# Patient Record
Sex: Male | Born: 1950 | Race: White | Hispanic: No | Marital: Married | Smoking: Never smoker
Health system: Southern US, Community
[De-identification: ages and names within clinical notes are randomized; demographics above are authoritative.]

## PROBLEM LIST (undated history)

## (undated) DIAGNOSIS — K219 Gastro-esophageal reflux disease without esophagitis: Secondary | ICD-10-CM

## (undated) DIAGNOSIS — Z9889 Other specified postprocedural states: Secondary | ICD-10-CM

## (undated) DIAGNOSIS — I82409 Acute embolism and thrombosis of unspecified deep veins of unspecified lower extremity: Secondary | ICD-10-CM

## (undated) HISTORY — DX: Acute embolism and thrombosis of unspecified deep veins of unspecified lower extremity: I82.409

## (undated) HISTORY — DX: Other specified postprocedural states: Z98.890

## (undated) HISTORY — PX: ESOPHAGECTOMY: SUR457

## (undated) HISTORY — PX: HAND SURGERY: SHX662

## (undated) HISTORY — PX: APPENDECTOMY: SHX54

## (undated) HISTORY — DX: Gastro-esophageal reflux disease without esophagitis: K21.9

## (undated) HISTORY — PX: HERNIA REPAIR: SHX51

---

## 1996-03-20 DIAGNOSIS — Z9889 Other specified postprocedural states: Secondary | ICD-10-CM

## 1996-03-20 HISTORY — DX: Other specified postprocedural states: Z98.890

## 2005-05-15 DIAGNOSIS — IMO0002 Reserved for concepts with insufficient information to code with codable children: Secondary | ICD-10-CM | POA: Insufficient documentation

## 2010-03-10 ENCOUNTER — Ambulatory Visit (HOSPITAL_COMMUNITY)
Admission: RE | Admit: 2010-03-10 | Discharge: 2010-03-10 | Payer: Self-pay | Source: Home / Self Care | Attending: Family Medicine | Admitting: Family Medicine

## 2010-05-30 LAB — CREATININE, SERUM
Creatinine, Ser: 1.06 mg/dL (ref 0.4–1.5)
GFR calc Af Amer: >60 mL/min (ref >60)
GFR calc non Af Amer: >60 mL/min (ref >60)

## 2010-11-19 DIAGNOSIS — Z9889 Other specified postprocedural states: Secondary | ICD-10-CM

## 2010-11-19 HISTORY — DX: Other specified postprocedural states: Z98.890

## 2010-12-12 ENCOUNTER — Encounter: Payer: Self-pay | Admitting: Gastroenterology

## 2010-12-12 ENCOUNTER — Ambulatory Visit (INDEPENDENT_AMBULATORY_CARE_PROVIDER_SITE_OTHER): Payer: Medicaid Other | Admitting: Gastroenterology

## 2010-12-12 VITALS — BP 152/87 | HR 51 | Temp 98.0°F | Ht 67.0 in | Wt 225.6 lb

## 2010-12-12 DIAGNOSIS — K219 Gastro-esophageal reflux disease without esophagitis: Secondary | ICD-10-CM

## 2010-12-12 DIAGNOSIS — Z7901 Long term (current) use of anticoagulants: Secondary | ICD-10-CM

## 2010-12-12 DIAGNOSIS — Z1211 Encounter for screening for malignant neoplasm of colon: Secondary | ICD-10-CM

## 2010-12-12 LAB — PROTIME-INR
INR: 1.72 — ABNORMAL HIGH (ref <1.50)
Prothrombin Time: 20.8 seconds — ABNORMAL HIGH (ref 11.6–15.2)

## 2010-12-12 NOTE — Patient Instructions (Signed)
We have set you up for an upper endoscopy with Dr. Darrick Penna. PLEASE hold your Coumadin starting TODAY.  Please have labs drawn today. We want to make sure you are not too anticoagulated for the procedure. We will contact you if any changes.

## 2010-12-12 NOTE — Progress Notes (Signed)
Referring Provider: Kandice Robinsons, FNP Primary Care Physician:  No primary provider on file. Primary Gastroenterologist:  Dr. Darrick Penna   Chief Complaint  Patient presents with  . EGD    HPI:   Jose Farmer is a pleasant 60 year old male who presents today at the request of Ms. Smith-Overman, FNP due to chronic GERD and now with symptoms of sore throat. He had been seen by ENT who recommended an EGD. Pt reports sore throat X 2 months. Denies dysphagia or odynophagia. He was on Dexilant in the past, but Medicaid did not cover this. He was switched to Protonix, which has seemed to help. Denies any abdominal pain, nausea, vomiting, melena or brbpr. He denies any weight loss or lack of appetite.  He has a history of multiple endoscopies in the remote past, last approximately 21 years ago by Dr. Dagoberto Ligas in [place], [place]. 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 We do not have any of these reports. His last colonoscopy was in 1998, which I did not know at the time of this office visit. Apparently, the colonoscopy was normal. This was done by Dr. Elder Cyphers at [facility].   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 On chronic Coumadin, managed by the [facility]. He routinely has held his Coumadin X 3-4 days with any procedure since then.     7.5 mg Coumadin on M, W, F, 5 on Tues, Thurs, Sat.   Past Medical History  Diagnosis Date  . GERD (gastroesophageal reflux disease)   . 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   . Gout     Past Surgical History  Procedure Date  . Appendectomy   . Hernia repair     ventral  . Esophagectomy     ? per pt report  . Hand surgery     tendons    Current Outpatient Prescriptions  Medication Sig Dispense Refill  . allopurinol (ZYLOPRIM) 300 MG tablet Take 300 mg by mouth daily. 1/2  tablet daily        . azelastine (ASTELIN) 137 MCG/SPRAY nasal spray Place 1 spray into the nose 2 (two) times daily. Use in each nostril as directed       . fluticasone (VERAMYST) 27.5 MCG/SPRAY nasal spray Place 2 sprays into the nose daily.        . furosemide (LASIX) 40 MG tablet Take 40 mg by mouth 2 (two) times daily.        . pantoprazole (PROTONIX) 40 MG tablet Take 40 mg by mouth daily.        . ranitidine (ZANTAC) 150 MG capsule Take 150 mg by mouth every evening.       . warfarin (COUMADIN) 5 MG tablet Take 5 mg by mouth daily. 7.5 mg on M, W, F. 5 mg on Tues, Thurs, Sat.        Allergies as of 12/12/2010  . (Not on File)    Family History  Problem Relation Age of Onset  . Colon cancer Neg Hx     History   Social History  . Marital Status: Married    Spouse Name: N/A    Number of Children: N/A  . Years of Education: N/A   Occupational History  . disability    Social History Main Topics  . Smoking status: Never Smoker   .  Smokeless tobacco: Not on file  . Alcohol Use: No  . Drug Use: No  . Sexually Active: Not on file   Other Topics Concern  . Not on file   Social History Narrative  . No narrative on file    Review of Systems: Gen: Denies any fever, chills, loss of appetite, fatigue, weight loss. CV: Denies chest pain, heart palpitations, syncope, peripheral edema. Resp: Denies shortness of breath with rest, cough, wheezing GI: Denies dysphagia or odynophagia. Denies hematemesis, fecal incontinence, or jaundice.  GU : Denies urinary burning, urinary frequency, urinary incontinence.  MS: Denies joint pain, muscle weakness, cramps, limited movement Derm: Denies rash, itching, dry skin Psych: Denies depression, anxiety, confusion or memory loss  Heme: Denies bruising, bleeding, and enlarged lymph nodes.  Physical Exam: BP 152/87  Pulse 51  Temp(Src) 98 F (36.7 C) (Temporal)  Ht 5\' 7"  (1.702 m)  Wt 225 lb 9.6 oz (102.331 kg)  BMI 35.33  kg/m2 General:   Alert and oriented. Well-developed, well-nourished, pleasant and cooperative. Head:  Normocephalic and atraumatic. Eyes:  Conjunctiva pink, sclera clear, no icterus.   Conjunctiva pink. Ears:  Normal auditory acuity. Nose:  No deformity, discharge,  or lesions. Mouth:  No deformity or lesions, mucosa pink and moist.  Neck:  Supple, without mass or thyromegaly. Lungs:  Clear to auscultation bilaterally, without wheezing, rales, or rhonchi.  Heart:  S1, S2 present without murmurs noted.  Abdomen:  +BS, soft, obese, non-tender and non-distended. Spider varicosities noted.Without mass or HSM. No rebound or guarding. No hernias noted. Rectal:  Deferred  Msk:  Symmetrical without gross deformities. Normal posture. Extremities:  LLE with chronic ulcerative changes secondary to venous insufficiency, Left slightly more edematous than right, trace edema bilaterally Neurologic:  Alert and  oriented x4;  grossly normal neurologically. Skin:  Intact, warm and dry without significant lesions or rashes Cervical Nodes:  No significant cervical adenopathy. Psych:  Alert and cooperative. Normal mood and affect.

## 2010-12-13 ENCOUNTER — Encounter: Payer: Self-pay | Admitting: Gastroenterology

## 2010-12-13 ENCOUNTER — Other Ambulatory Visit: Payer: Self-pay | Admitting: Gastroenterology

## 2010-12-13 DIAGNOSIS — K219 Gastro-esophageal reflux disease without esophagitis: Secondary | ICD-10-CM | POA: Insufficient documentation

## 2010-12-13 DIAGNOSIS — D649 Anemia, unspecified: Secondary | ICD-10-CM

## 2010-12-13 DIAGNOSIS — Z7901 Long term (current) use of anticoagulants: Secondary | ICD-10-CM | POA: Insufficient documentation

## 2010-12-13 NOTE — Assessment & Plan Note (Signed)
Addressed.

## 2010-12-13 NOTE — Assessment & Plan Note (Signed)
At time of visit, did not realize colonoscopy was from 1998. This was normal at that time. He is due for routine screening. Due to his insurance possibly running out the end of the month, as well as need for stopping anticoagulation, I attempted to add this procedure. However, the number listed for Jose Farmer is no longer a working number, and there is no alternative number. We will evaluate the need for a screening colonoscopy after the EGD is completed and patient is seen back in the office.

## 2010-12-13 NOTE — Assessment & Plan Note (Signed)
60 year old male with hx of chronic GERD, maintained in past on Dexilant, more recently doing well on Protonix. However, symptoms of sore throat for several months, unresolved with treatment; has seen ENT who recommended EGD. He has had multiple dilations in the remote past per his report; he actually underwent an esophagectomy per his description. We do not have these op notes but have requested them. He is on Coumadin chronically for the past 21 years after DVT formation. He routinely holds this 3-4 days prior to any procedures without any issues. It is likely safe to do this in this scenario as well. It is managed by the Health Department. We will draw PT/INR today.  Proceed with upper endoscopy in the near future with Dr. Darrick Penna. The risks, benefits, and alternatives have been discussed in detail with patient. They have stated understanding and desire to proceed.  Hold Coumadin X 4 days. PT/INR today

## 2010-12-14 NOTE — Progress Notes (Signed)
Cc to Referring Dr.

## 2010-12-15 ENCOUNTER — Telehealth: Payer: Self-pay

## 2010-12-15 MED ORDER — SODIUM CHLORIDE 0.45 % IV SOLN
Freq: Once | INTRAVENOUS | Status: AC
  Administered 2010-12-16: 13:00:00 via INTRAVENOUS

## 2010-12-15 NOTE — Progress Notes (Signed)
Quick Note:  Called to inform pt. Many rings, no VM. Called and spoke with [facility] Dept. She scheduled appt for pt on Tues, 12/20/2010 @ 10:30 AM. She also had me leave verbal order for PT/INR on VM for the nurse, and I reminded the nurse that they are the ones that have been managing his coumadin. ______

## 2010-12-15 NOTE — Progress Notes (Signed)
Pt needs lovenox bridge.

## 2010-12-15 NOTE — Progress Notes (Signed)
NOTED  PT SHOULD HAVE A LOVENOX BRIDGE. Hold lovenox on 9/28.

## 2010-12-15 NOTE — Progress Notes (Signed)
Quick Note:  Noted. Pt has been on Coumadin X 21 years, hx of difficulties with obtaining therapeutic INR. This was drawn at office visit prior to holding Coumadin (holding Coumadin X 4 days) just for baseline. He has held Coumadin in the past for procedures. He is scheduled on Friday for procedure with Dr. Darrick Penna. Sees [facility] for management of Coumadin.   Doris: Please make appointment for patient to be seen at [facility] Department next week for PT/INR. Will need adjustments to obtain therapeutic INR.   Routing to Dr. Darrick Penna for FYI. ______

## 2010-12-15 NOTE — Telephone Encounter (Signed)
I have been unable to communicate to pt the appt at the [facility] next week to check PT/INR. Called ENDO and spoke with Amy and she will ensure the pt gets that info tomorrow at discharge following procedure.

## 2010-12-15 NOTE — Progress Notes (Signed)
Attempted to call pt multiple times. Will continue to try to reach today for lovenox today, holding tomorrow.

## 2010-12-16 ENCOUNTER — Encounter (HOSPITAL_COMMUNITY)
Admission: RE | Disposition: A | Discharge status: Home / Self Care | Payer: Self-pay | Source: Ambulatory Visit | Attending: Gastroenterology

## 2010-12-16 ENCOUNTER — Encounter (HOSPITAL_COMMUNITY): Payer: Self-pay

## 2010-12-16 ENCOUNTER — Ambulatory Visit (HOSPITAL_COMMUNITY)
Admission: RE | Admit: 2010-12-16 | Discharge: 2010-12-16 | Disposition: A | Discharge status: Home / Self Care | Payer: Medicaid Other | Source: Ambulatory Visit | Attending: Gastroenterology | Admitting: Gastroenterology

## 2010-12-16 ENCOUNTER — Other Ambulatory Visit: Payer: Self-pay | Admitting: Gastroenterology

## 2010-12-16 ENCOUNTER — Ambulatory Visit (HOSPITAL_COMMUNITY): Payer: Medicaid Other

## 2010-12-16 DIAGNOSIS — K2990 Gastroduodenitis, unspecified, without bleeding: Secondary | ICD-10-CM

## 2010-12-16 DIAGNOSIS — K219 Gastro-esophageal reflux disease without esophagitis: Secondary | ICD-10-CM | POA: Insufficient documentation

## 2010-12-16 DIAGNOSIS — R490 Dysphonia: Secondary | ICD-10-CM | POA: Insufficient documentation

## 2010-12-16 DIAGNOSIS — Z7901 Long term (current) use of anticoagulants: Secondary | ICD-10-CM | POA: Insufficient documentation

## 2010-12-16 DIAGNOSIS — K2970 Gastritis, unspecified, without bleeding: Secondary | ICD-10-CM

## 2010-12-16 DIAGNOSIS — K2960 Other gastritis without bleeding: Secondary | ICD-10-CM | POA: Insufficient documentation

## 2010-12-16 HISTORY — PX: ESOPHAGOGASTRODUODENOSCOPY: SHX5428

## 2010-12-16 SURGERY — EGD (ESOPHAGOGASTRODUODENOSCOPY)
Anesthesia: Moderate Sedation

## 2010-12-16 MED ORDER — BUTAMBEN-TETRACAINE-BENZOCAINE 2-2-14 % EX AERO
INHALATION_SPRAY | CUTANEOUS | Status: DC | PRN
  Administered 2010-12-16: 1 via TOPICAL

## 2010-12-16 MED ORDER — MIDAZOLAM HCL 5 MG/5ML IJ SOLN
INTRAMUSCULAR | Status: AC
  Filled 2010-12-16: qty 10

## 2010-12-16 MED ORDER — MEPERIDINE HCL 100 MG/ML IJ SOLN
INTRAMUSCULAR | Status: DC | PRN
  Administered 2010-12-16: 50 mg

## 2010-12-16 MED ORDER — PANTOPRAZOLE SODIUM 40 MG PO TBEC: DELAYED_RELEASE_TABLET | ORAL | Status: DC

## 2010-12-16 MED ORDER — MIDAZOLAM HCL 5 MG/5ML IJ SOLN
INTRAMUSCULAR | Status: DC | PRN
  Administered 2010-12-16: 1 mg via INTRAVENOUS
  Administered 2010-12-16: 2 mg via INTRAVENOUS

## 2010-12-16 MED ORDER — MEPERIDINE HCL 100 MG/ML IJ SOLN
INTRAMUSCULAR | Status: AC
  Filled 2010-12-16: qty 1

## 2010-12-16 MED ORDER — ENOXAPARIN SODIUM 150 MG/ML ~~LOC~~ SOLN: 1.0000 mg/kg | Freq: Two times a day (BID) | SUBCUTANEOUS | Status: AC

## 2010-12-16 NOTE — Interval H&P Note (Signed)
History and Physical Interval Note:   12/16/2010   1:51 PM   Jose Farmer  has presented today for surgery, with the diagnosis of GERD  The various methods of treatment have been discussed with the patient and family. After consideration of risks, benefits and other options for treatment, the patient has consented to  Procedure(s): ESOPHAGOGASTRODUODENOSCOPY (EGD) as a surgical intervention .  I have reviewed the patients' chart and labs.  Questions were answered to the patient's satisfaction.     [name]  MD

## 2010-12-16 NOTE — H&P (Signed)
Reason for Visit     EGD        Vitals - Last Recorded       BP Pulse Temp(Src) Ht Wt BMI    152/87  51  98 F (36.7 C) (Temporal)  5\' 7"  (1.702 m)  225 lb 9.6 oz (102.331 kg)  35.33 kg/m2          Progress Notes     Gerrit Halls, NP  12/13/2010  4:20 PM  Signed   Referring Provider: Kandice Robinsons, FNP Primary Care Physician:  No primary provider on file. Primary Gastroenterologist:  Dr. Darrick Penna     Chief Complaint   Patient presents with   .  EGD      HPI:    Mr. Burgo is a pleasant 60 year old male who presents today at the request of Ms. Smith-Overman, FNP due to chronic GERD and now with symptoms of sore throat. He had been seen by ENT who recommended an EGD. Pt reports sore throat X 2 months. Denies dysphagia or odynophagia. He was on Dexilant in the past, but Medicaid did not cover this. He was switched to Protonix, which has seemed to help. Denies any abdominal pain, nausea, vomiting, melena or brbpr. He denies any weight loss or lack of appetite.   He has a history of multiple endoscopies in the remote past, last approximately 21 years ago by Dr. Dagoberto Ligas in [place], [place]. 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 We do not have any of these reports. His last colonoscopy was in 1998, which I did not know at the time of this office visit. Apparently, the colonoscopy was normal. This was done by Dr. Elder Cyphers at [facility].    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 On chronic Coumadin, managed by the [facility]. He routinely has held his Coumadin X 3-4 days with any procedure since then.        7.5 mg Coumadin on M, W, F, 5 on Tues, Thurs, Sat.     Past Medical History   Diagnosis  Date   .  GERD (gastroesophageal reflux disease)     .  DVT (deep venous thrombosis)         21 years ago, maintained on Coumadin since that time.     .  Gout         Past Surgical History   Procedure  Date   .  Appendectomy     .  Hernia repair         ventral   .  Esophagectomy         ? per pt report   .  Hand surgery         tendons       Current Outpatient Prescriptions   Medication  Sig  Dispense  Refill   .  allopurinol (ZYLOPRIM) 300 MG tablet  Take 300 mg by mouth daily. 1/2 tablet daily            .  azelastine (ASTELIN) 137 MCG/SPRAY nasal spray  Place 1 spray into the nose 2 (two) times daily. Use in each nostril as directed          .  fluticasone (VERAMYST) 27.5 MCG/SPRAY nasal spray  Place 2 sprays into the nose daily.           .  furosemide (  LASIX) 40 MG tablet  Take 40 mg by mouth 2 (two) times daily.           .  pantoprazole (PROTONIX) 40 MG tablet  Take 40 mg by mouth daily.           .  ranitidine (ZANTAC) 150 MG capsule  Take 150 mg by mouth every evening.          .  warfarin (COUMADIN) 5 MG tablet  Take 5 mg by mouth daily. 7.5 mg on M, W, F. 5 mg on Tues, Thurs, Sat.             Allergies as of 12/12/2010   .  (Not on File)       Family History   Problem  Relation  Age of Onset   .  Colon cancer  Neg Hx         History       Social History   .  Marital Status:  Married       Spouse Name:  N/A       Number of Children:  N/A   .  Years of Education:  N/A       Occupational History   .  disability         Social History Main Topics   .  Smoking status:  Never Smoker    .  Smokeless tobacco:  Not on file   .  Alcohol Use:  No   .  Drug Use:  No   .  Sexually Active:  Not on file       Other Topics  Concern   .  Not on file       Social History Narrative   .  No narrative on file      Review of Systems: Gen: Denies any fever, chills, loss of appetite, fatigue, weight loss. CV: Denies chest pain, heart palpitations, syncope, peripheral edema. Resp: Denies shortness of breath with rest, cough, wheezing GI: Denies dysphagia or odynophagia. Denies hematemesis, fecal  incontinence, or jaundice.   GU : Denies urinary burning, urinary frequency, urinary incontinence.   MS: Denies joint pain, muscle weakness, cramps, limited movement Derm: Denies rash, itching, dry skin Psych: Denies depression, anxiety, confusion or memory loss   Heme: Denies bruising, bleeding, and enlarged lymph nodes.   Physical Exam: BP 152/87  Pulse 51  Temp(Src) 98 F (36.7 C) (Temporal)  Ht 5\' 7"  (1.702 m)  Wt 225 lb 9.6 oz (102.331 kg)  BMI 35.33 kg/m2 General:   Alert and oriented. Well-developed, well-nourished, pleasant and cooperative. Head:  Normocephalic and atraumatic. Eyes:  Conjunctiva pink, sclera clear, no icterus.   Conjunctiva pink. Ears:  Normal auditory acuity. Nose:  No deformity, discharge,  or lesions. Mouth:  No deformity or lesions, mucosa pink and moist.   Neck:  Supple, without mass or thyromegaly. Lungs:  Clear to auscultation bilaterally, without wheezing, rales, or rhonchi.   Heart:  S1, S2 present without murmurs noted.   Abdomen:  +BS, soft, obese, non-tender and non-distended. Spider varicosities noted.Without mass or HSM. No rebound or guarding. No hernias noted. Rectal:  Deferred   Msk:  Symmetrical without gross deformities. Normal posture. Extremities:  LLE with chronic ulcerative changes secondary to venous insufficiency, Left slightly more edematous than right, trace edema bilaterally Neurologic:  Alert and  oriented x4;  grossly normal neurologically. Skin:  Intact, warm and dry without significant lesions or rashes Cervical Nodes:  No significant  cervical adenopathy. Psych:  Alert and cooperative. Normal mood and affect.       Glendora Score  12/14/2010  8:24 AM  Signed Cc to Referring Dr.  Gerrit Halls, NP  12/15/2010  8:36 AM  Signed Quick Note:   Noted. Pt has been on Coumadin X 21 years, hx of difficulties with obtaining therapeutic INR. This was drawn at office visit prior to holding Coumadin (holding Coumadin X 4 days) just for  baseline. He has held Coumadin in the past for procedures. He is scheduled on Friday for procedure with Dr. Darrick Penna. Sees [facility] Department for management of Coumadin.     Doris: Please make appointment for patient to be seen at [facility] Department next week for PT/INR. Will need adjustments to obtain therapeutic INR.     Routing to Dr. Darrick Penna for FYI. ______  [name], MD  12/15/2010 10:05 AM  Signed NOTED   PT SHOULD HAVE A LOVENOX BRIDGE. Hold lovenox on 9/28.  [name], MD  12/15/2010 10:08 AM  Signed Pt needs lovenox bridge.  Cloria Spring, LPN, LPN  1/61/0960 45:40 AM  Signed Quick Note:   Called to inform pt. Many rings, no VM. Called and spoke with [facility] Dept. She scheduled appt for pt on Tues, 12/20/2010 @ 10:30 AM. She also had me leave verbal order for PT/INR on VM for the nurse, and I reminded the nurse that they are the ones that have been managing his coumadin. ______  Gerrit Halls, NP  12/15/2010 10:46 AM  Signed Attempted to call pt multiple times. Will continue to try to reach today for lovenox today, holding tomorrow.          GERD (gastroesophageal reflux disease) - Gerrit Halls, NP  12/13/2010  4:15 PM  Signed 60 year old male with hx of chronic GERD, maintained in past on Dexilant, more recently doing well on Protonix. However, symptoms of sore throat for several months, unresolved with treatment; has seen ENT who recommended EGD. He has had multiple dilations in the remote past per his report; he actually underwent an esophagectomy per his description. We do not have these op notes but have requested them. He is on Coumadin chronically for the past 21 years after DVT formation. He routinely holds this 3-4 days prior to any procedures without any issues. It is likely safe to do this in this scenario as well. It is managed by the [facility]. We will draw PT/INR today.   Proceed with upper endoscopy in the near future with Dr. Darrick Penna. The risks,  benefits, and alternatives have been discussed in detail with patient. They have stated understanding and desire to proceed.  Hold Coumadin X 4 days. PT/INR today     Warfarin anticoagulation - Gerrit Halls, NP  12/13/2010  4:15 PM  Signed Addressed.    Encounter for screening colonoscopy - Gerrit Halls, NP  12/13/2010  4:20 PM  Signed At time of visit, did not realize colonoscopy was from 1998. This was normal at that time. He is due for routine screening. Due to his insurance possibly running out the end of the month, as well as need for stopping anticoagulation, I attempted to add this procedure. However, the number listed for Mr. Kempker is no longer a working number, and there is no alternative number. We will evaluate the need for a screening colonoscopy after the EGD is completed and patient is seen back in the office.

## 2010-12-16 NOTE — Progress Notes (Signed)
Pt transferred to Phase 11 following Endoscopy procedure. Radiology arrived to transport to [place] for Doppler study of left leg via stretcher.

## 2010-12-16 NOTE — Progress Notes (Signed)
Pt and daughter instructed  on how to give lovenox  Injections, verbalized understanding.

## 2010-12-16 NOTE — Progress Notes (Signed)
Returned from x-ray via stretcher.

## 2010-12-22 ENCOUNTER — Telehealth: Payer: Self-pay | Admitting: Gastroenterology

## 2010-12-22 NOTE — Telephone Encounter (Signed)
Please call pt. His stomach Bx shows mild gastritis. Continue PROTONIX 30 minutes prior to meals TWICE DAILY. FOLLOW A LOW FAT DIET. LOSE 10 LBS. OPV LATE NOV OR EARLY DEC 2012.

## 2010-12-22 NOTE — Telephone Encounter (Signed)
Pt informed

## 2010-12-23 NOTE — Telephone Encounter (Signed)
Results Cc to PCP  

## 2010-12-23 NOTE — Telephone Encounter (Signed)
Pt is aware of OV for 12/4 at 0830 with AS

## 2010-12-26 ENCOUNTER — Encounter (HOSPITAL_COMMUNITY): Payer: Self-pay | Admitting: Gastroenterology

## 2011-01-17 ENCOUNTER — Other Ambulatory Visit (HOSPITAL_COMMUNITY): Payer: Self-pay | Admitting: Family Medicine

## 2011-01-17 ENCOUNTER — Ambulatory Visit (HOSPITAL_COMMUNITY)
Admission: RE | Admit: 2011-01-17 | Discharge: 2011-01-17 | Disposition: A | Discharge status: Home / Self Care | Payer: Medicaid Other | Source: Ambulatory Visit | Attending: Family Medicine | Admitting: Family Medicine

## 2011-01-17 DIAGNOSIS — R109 Unspecified abdominal pain: Secondary | ICD-10-CM | POA: Insufficient documentation

## 2011-01-27 ENCOUNTER — Encounter: Payer: Self-pay | Admitting: Gastroenterology

## 2011-01-27 ENCOUNTER — Telehealth: Payer: Self-pay | Admitting: Gastroenterology

## 2011-01-27 NOTE — Telephone Encounter (Signed)
Mr. Leggette called asking for results of his procedure please advise

## 2011-01-27 NOTE — Telephone Encounter (Signed)
REVIEWED. AGREE. 

## 2011-01-27 NOTE — Telephone Encounter (Signed)
Called pt. He remembers that I called him on 12/22/2010 and gave results. He is still having some abdominal pain and nausea, and thinks he might have celiac because of what he has read on the internet. I have scheduled him to see Gerrit Halls, NP on 01/30/2011 at 3:30.

## 2011-01-30 ENCOUNTER — Encounter: Payer: Self-pay | Admitting: Gastroenterology

## 2011-01-30 ENCOUNTER — Ambulatory Visit (INDEPENDENT_AMBULATORY_CARE_PROVIDER_SITE_OTHER): Payer: Medicaid Other | Admitting: Gastroenterology

## 2011-01-30 VITALS — BP 120/76 | HR 72 | Temp 96.5°F | Ht 67.0 in | Wt 226.0 lb

## 2011-01-30 DIAGNOSIS — R1030 Lower abdominal pain, unspecified: Secondary | ICD-10-CM

## 2011-01-30 DIAGNOSIS — R109 Unspecified abdominal pain: Secondary | ICD-10-CM

## 2011-01-30 DIAGNOSIS — R197 Diarrhea, unspecified: Secondary | ICD-10-CM

## 2011-01-30 DIAGNOSIS — Z1211 Encounter for screening for malignant neoplasm of colon: Secondary | ICD-10-CM

## 2011-01-30 DIAGNOSIS — K219 Gastro-esophageal reflux disease without esophagitis: Secondary | ICD-10-CM

## 2011-01-30 NOTE — Progress Notes (Signed)
Referring Provider: Kandice Robinsons, FNP Primary Care Physician:  Provider Not In System Primary Gastroenterologist: Dr. Darrick Penna   Chief Complaint  Patient presents with  . Abdominal Pain    HPI:   Jose Farmer returns today in follow-up after EGD showing mild gastritis, no H.pylori. We attempted to add a colonoscopy to the procedure after review of his past records; however, we were unable to get up with him. He has a hx of a DVT and will need Lovenox bridging prior to any further procedures. In fact, when he presented for the EGD, he underwent duplex ultrasound showing a non-occlusive band-like thrombus within left popliteal vein suspicious for resolving subacute DVT vs chronic DVT. He was started on Lovenox and is now back on Coumadin. He presents today worried he may have celiac disease; he has been researching on the internet. Reports LLQ, wrapping across lower abdomen, for the past 3 weeks to a month. States it is constant but worsens in intensity. Bending over and picking things up worsens the discomfort. Afebrile, no chills. Around a 6-7 at worst. Described as sharp, almost like gas pain. Complains of intermittent diarrhea/constipation. This diarrhea is reported as max of 2 loose stools per day. No blood in stool. Intermittent nausea when in pain. Last round of abx in August, early September when he had a sore throat. Has well water. No sick contacts. Occasional bloating.   Doing well on current PPI; however, he would like to switch to Dexilant. He is unable to switch to this until [place] runs out end of November, then he can qualify for patient assistance.    Past Medical History  Diagnosis Date  . GERD (gastroesophageal reflux disease)   . 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   . Gout   . S/P endoscopy Sept 2012    mild gastritis, negative biopsies, short esophagus, not candidate for reflux surgery or Bravo capsule placement      Past Surgical History  Procedure Date  . Appendectomy   . Hernia repair     ventral  . Hand surgery     tendons  . Esophagogastroduodenoscopy 12/16/2010    Procedure: ESOPHAGOGASTRODUODENOSCOPY (EGD);  Surgeon: Arlyce Harman, MD;  Location: [facility];  Service: Endoscopy;  Laterality: N/A;  2:00  . Esophagectomy     ? per pt report    Current Outpatient Prescriptions  Medication Sig Dispense Refill  . allopurinol (ZYLOPRIM) 300 MG tablet Take 150 mg by mouth daily.       . furosemide (LASIX) 40 MG tablet Take 40 mg by mouth daily.       . pantoprazole (PROTONIX) 40 MG tablet 1 PO 30 MINUTES BEFORE MEALS TWICE DAILY  60 tablet  11  . ranitidine (ZANTAC) 150 MG capsule Take 150 mg by mouth every evening.       . warfarin (COUMADIN) 5 MG tablet Take 5 mg by mouth daily. 7.5 mg on M, W, F. 5 mg on Tues, Thurs, Sat.      Marland Kitchen azelastine (ASTELIN) 137 MCG/SPRAY nasal spray Place 1 spray into the nose daily.       . fluticasone (VERAMYST) 27.5 MCG/SPRAY nasal spray Place 2 sprays into the nose daily.          Allergies as of 01/30/2011 - Review Complete 01/30/2011  Allergen Reaction Noted  . Indocin Other (See Comments) 12/14/2010    Family History  Problem Relation Age of Onset  .  Colon cancer Neg Hx     History   Social History  . Marital Status: Married    Spouse Name: N/A    Number of Children: N/A  . Years of Education: N/A   Occupational History  . disability    Social History Main Topics  . Smoking status: Never Smoker   . Smokeless tobacco: None  . Alcohol Use: No  . Drug Use: No  . Sexually Active: None   Other Topics Concern  . None   Social History Narrative  . None    Review of Systems: Gen: Denies fever, chills, anorexia. Denies fatigue, weakness, weight loss.  CV: Denies chest pain, palpitations, syncope, peripheral edema, and claudication. Resp: Denies dyspnea at rest, cough, wheezing, coughing up blood, and pleurisy. GI: SEE HPI Derm:  Denies rash, itching, dry skin Psych: Denies depression, anxiety, memory loss, confusion. No homicidal or suicidal ideation.  Heme: Denies bruising, bleeding, and enlarged lymph nodes.  Physical Exam: BP 120/76  Pulse 72  Temp(Src) 96.5 F (35.8 C) (Temporal)  Ht 5\' 7"  (1.702 m)  Wt 226 lb (102.513 kg)  BMI 35.40 kg/m2 General:   Alert and oriented. No distress noted. Pleasant and cooperative.  Head:  Normocephalic and atraumatic. Eyes:  Conjuctiva clear without scleral icterus. Mouth:  Oral mucosa pink and moist. Good dentition. No lesions. Neck:  Supple, without mass or thyromegaly. Heart:  S1, S2 present without murmurs, rubs, or gallops. Regular rate and rhythm. Abdomen:  +BS, soft, non-tender and non-distended. No rebound or guarding. No HSM or masses noted. Msk:  Symmetrical without gross deformities. Normal posture. Extremities:  Largely edematous bilaterally, chronic.  Neurologic:  Alert and  oriented x4;  grossly normal neurologically. Skin:  Intact without significant lesions or rashes. Cervical Nodes:  No significant cervical adenopathy. Psych:  Alert and cooperative. Normal mood and affect.

## 2011-01-30 NOTE — Patient Instructions (Signed)
Please have stool samples completed and blood work done. We will be contacting you with these results.  Start taking a Probiotic daily. Samples have been provided. You are able to get this over-the-counter as well.  Please see the high fiber diet handout. We ultimately need to proceed with a colonoscopy; I would like to get the stool results back first. If you have severe abdominal pain, please call our office immediately or seek medical attention.  Watch for symptoms such as increased pain, fever, chills, nausea, or vomiting. If you have any of these, seek medical attention. We will hold off on a CT scan right now unless something changes. We will be planning on a colonoscopy in the near future after review of the above things.

## 2011-01-31 ENCOUNTER — Encounter: Payer: Self-pay | Admitting: Gastroenterology

## 2011-01-31 DIAGNOSIS — R1030 Lower abdominal pain, unspecified: Secondary | ICD-10-CM | POA: Insufficient documentation

## 2011-01-31 DIAGNOSIS — R197 Diarrhea, unspecified: Secondary | ICD-10-CM | POA: Insufficient documentation

## 2011-01-31 LAB — CBC WITH DIFFERENTIAL/PLATELET
Basophils Absolute: 0 10*3/uL (ref 0.0–0.1)
Basophils Relative: 0 % (ref 0–1)
Eosinophils Absolute: 0.2 10*3/uL (ref 0.0–0.7)
Eosinophils Relative: 3 % (ref 0–5)
HCT: 43.7 % (ref 39.0–52.0)
Hemoglobin: 14.3 g/dL (ref 13.0–17.0)
Lymphocytes Relative: 22 % (ref 12–46)
Lymphs Abs: 1.7 10*3/uL (ref 0.7–4.0)
MCH: 28 pg (ref 26.0–34.0)
MCHC: 32.7 g/dL (ref 30.0–36.0)
MCV: 85.5 fL (ref 78.0–100.0)
Monocytes Absolute: 0.7 10*3/uL (ref 0.1–1.0)
Monocytes Relative: 10 % (ref 3–12)
Neutro Abs: 5.1 10*3/uL (ref 1.7–7.7)
Neutrophils Relative %: 66 % (ref 43–77)
Platelets: 247 10*3/uL (ref 150–400)
RBC: 5.11 MIL/uL (ref 4.22–5.81)
RDW: 14.4 % (ref 11.5–15.5)
WBC: 7.7 10*3/uL (ref 4.0–10.5)

## 2011-01-31 LAB — TISSUE TRANSGLUTAMINASE, IGA: Tissue Transglutaminase Ab, IgA: 16.3 U/mL (ref <20)

## 2011-01-31 LAB — IGA: IgA: 419 mg/dL — ABNORMAL HIGH (ref 68–379)

## 2011-01-31 NOTE — Assessment & Plan Note (Addendum)
60 year old male with concerns of bloating, intermittent constipation and diarrhea (described as max of 2 loose stools per day), no melena or hematochezia. Remote use of abx a few months ago. Likely IBS related, dietary factors. Ultimately needs a colonoscopy; last was in 1998. Will assess with the following first, then proceed with colonoscopy after review.  Cdiff PCR, lactoferrin, culture, Giardia CBC Celiac panel Probiotic High fiber diet

## 2011-02-01 NOTE — Assessment & Plan Note (Signed)
Assess stool studies first, then proceed with colonoscopy. Will need Lovenox bridging.

## 2011-02-01 NOTE — Assessment & Plan Note (Signed)
Intermittent LLQ pain, wrapping across lower abdomen, worsened with movement. Experiences nausea with pain. No fever or chills, no bloody stool. Intermittent constipation/diarrhea as described above. Ultimately needs colonoscopy. Hold off on CT for now and institute dietary behaviors, probiotic. Question musculoskeletal component due to exacerbation with movement.

## 2011-02-01 NOTE — Progress Notes (Signed)
Cc to the [facility] Dept

## 2011-02-01 NOTE — Assessment & Plan Note (Signed)
Well-controlled currently. He would like to switch to Dexilant when runs out of Medicaid, as he could then qualify for patient assistance.

## 2011-02-03 LAB — CLOSTRIDIUM DIFFICILE BY PCR: Toxigenic C. Difficile by PCR: NOT DETECTED

## 2011-02-03 LAB — FECAL LACTOFERRIN, QUANT: Lactoferrin: NEGATIVE

## 2011-02-03 LAB — GIARDIA ANTIGEN: Giardia Screen (EIA): NEGATIVE

## 2011-02-06 LAB — STOOL CULTURE

## 2011-02-07 NOTE — Progress Notes (Signed)
LAST CT A/P DEC 2011 FOR LLQ. HAD SML UMBILICAL HERNIA CONTAINING FAT.  NOV 2012: STOOL CX, C DIFF, GIARDIA Ag, TTG IgA NEG. ABD PAIN/INTERMITTENT LOOSE STOOL LIKELY 2o TO IBS REVIEWED. AGREE.

## 2011-02-13 ENCOUNTER — Encounter: Payer: Self-pay | Admitting: Gastroenterology

## 2011-02-13 ENCOUNTER — Ambulatory Visit (INDEPENDENT_AMBULATORY_CARE_PROVIDER_SITE_OTHER): Payer: Medicaid Other | Admitting: Gastroenterology

## 2011-02-13 VITALS — BP 135/80 | HR 57 | Temp 97.5°F | Ht 67.0 in | Wt 228.2 lb

## 2011-02-13 DIAGNOSIS — R109 Unspecified abdominal pain: Secondary | ICD-10-CM

## 2011-02-13 DIAGNOSIS — Z1211 Encounter for screening for malignant neoplasm of colon: Secondary | ICD-10-CM

## 2011-02-13 DIAGNOSIS — Z7901 Long term (current) use of anticoagulants: Secondary | ICD-10-CM

## 2011-02-13 DIAGNOSIS — R1030 Lower abdominal pain, unspecified: Secondary | ICD-10-CM

## 2011-02-13 DIAGNOSIS — K219 Gastro-esophageal reflux disease without esophagitis: Secondary | ICD-10-CM

## 2011-02-13 MED ORDER — ENOXAPARIN SODIUM 30 MG/0.3ML ~~LOC~~ SOLN: 1.0000 mg/kg | Freq: Two times a day (BID) | SUBCUTANEOUS | Status: DC

## 2011-02-13 MED ORDER — ENOXAPARIN SODIUM 30 MG/0.3ML ~~LOC~~ SOLN: 1.0000 mg/kg | Freq: Two times a day (BID) | SUBCUTANEOUS | Status: AC

## 2011-02-13 NOTE — Assessment & Plan Note (Signed)
60 year old male with last colonoscopy in 1998, normal. Need for average risk screening colonoscopy at this time. Diarrhea has resolved with instituting probiotic, high fiber diet. Stool studies, celiac panel negative (drawn per pt's request). 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 See warfarin anticoagulation for details.  Proceed with colonoscopy with Dr. Darrick Penna in the near future. The risks, benefits, and alternatives have been discussed in detail with the patient. They state understanding and desire to proceed.

## 2011-02-13 NOTE — Progress Notes (Signed)
Instructions for Lovenox bridge prior to colonoscopy on Dec 21st.  Last dose of Coumadin December 16th.  Start lovenox 100 mg subcutaneously, twice a day on December 17th.  Continue the 18th and 19th as well. Last dose of Lovenox December 20th in the morning. None that evening, none the 21st.  Coumadin to resume after procedure per Dr. Darrick Penna.

## 2011-02-13 NOTE — Progress Notes (Signed)
Referring Provider: Patricia Smith-Overman, FNP Primary Care Physician:  Smith-Overman, Patty, NP Primary Gastroenterologist: Dr. Fields    Chief Complaint  Patient presents with  . 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 This was at the time of the EGD with Dr. Fields. He continues to take Coumadin.   At his last visit, he complained of LLQ abdominal pain, intermittent diarrhea and constipation. Diarrhea was a max of 2 loose stools per day. He denied melena or hematochezia. He was worried about celiac disease. Stool studies were done at that time, as well as celiac panel. Findings all WNL.   He presents now for an updated screening colonoscopy, last done in 1998. States has a BM every day. Trying to follow a high fiber diet. Taking probiotic daily. Continues to complain of LLQ abdominal pain, underlying, waxing and waning. Some days doesn't bother him. Usually worse if moving around. Denies fever or chills. No blood in stool.   Last CT Dec 2011:  Cholelithiasis.  No other significant intra abdominal or intra pelvic abnormalities.  Stomach within lower chest, by history suspect gastric pull-up.  Small umbilical hernia containing fat.     Past Medical History  Diagnosis Date  . GERD (gastroesophageal reflux disease)   . 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   . Gout   . S/P endoscopy Sept 2012    mild gastritis, negative biopsies, short esophagus, not candidate for reflux surgery or Bravo capsule placement  . S/P colonoscopy 1998    Dr. Shiflett, normal    Past Surgical History  Procedure Date  . Appendectomy   . Hernia repair     ventral  . Hand surgery     tendons  .  Esophagogastroduodenoscopy 12/16/2010    Procedure: ESOPHAGOGASTRODUODENOSCOPY (EGD);  Surgeon: Sandi M Fields, MD;  Location: [facility];  Service: Endoscopy;  Laterality: N/A;  2:00  . Esophagectomy     ? per pt report    Current Outpatient Prescriptions  Medication Sig Dispense Refill  . allopurinol (ZYLOPRIM) 300 MG tablet Take 150 mg by mouth daily.       . azelastine (ASTELIN) 137 MCG/SPRAY nasal spray Place 1 spray into the nose daily.       . fluticasone (VERAMYST) 27.5 MCG/SPRAY nasal spray Place 2 sprays into the nose daily.        . furosemide (LASIX) 40 MG tablet Take 40 mg by mouth daily.       . pantoprazole (PROTONIX) 40 MG tablet 1 PO 30 MINUTES BEFORE MEALS TWICE DAILY  60 tablet  11  . ranitidine (ZANTAC) 150 MG capsule Take 150 mg by mouth every evening.       . warfarin (COUMADIN) 5 MG tablet Take 5 mg by mouth daily. 7.5 mg on M, W, F. 5 mg on Tues, Thurs, Sat.        Allergies as of 02/13/2011 - Review Complete 02/13/2011  Allergen Reaction Noted  . Indocin Other (See Comments) 12/14/2010    Family History  Problem Relation Age of Onset  . Colon cancer Neg Hx     History   Social History  . Marital Status: Married    Spouse Name: N/A      Number of Children: N/A  . Years of Education: N/A   Occupational History  . disability    Social History Main Topics  . Smoking status: Never Smoker   . Smokeless tobacco: None  . Alcohol Use: No  . Drug Use: No  . Sexually Active: None   Other Topics Concern  . None   Social History Narrative  . None    Review of Systems: Gen: Denies fever, chills, anorexia. Denies fatigue, weakness, weight loss.  CV: Denies chest pain, palpitations, syncope, peripheral edema, and claudication. Resp: Denies dyspnea at rest, cough, wheezing, coughing up blood, and pleurisy. GI: Denies vomiting blood, jaundice, and fecal incontinence.   Denies dysphagia or odynophagia. Derm: Denies rash, itching, dry skin Psych: Denies  depression, anxiety, memory loss, confusion. No homicidal or suicidal ideation.  Heme: Denies bruising, bleeding, and enlarged lymph nodes.  Physical Exam: BP 135/80  Pulse 57  Temp(Src) 97.5 F (36.4 C) (Temporal)  Ht 5' 7" (1.702 m)  Wt 228 lb 3.2 oz (103.511 kg)  BMI 35.74 kg/m2 General:   Alert and oriented. No distress noted. Pleasant and cooperative.  Head:  Normocephalic and atraumatic. Eyes:  Conjuctiva clear without scleral icterus. Mouth:  Oral mucosa pink and moist. Good dentition. No lesions. Neck:  Supple, without mass or thyromegaly. Heart:  S1, S2 present without murmurs, rubs, or gallops. Regular rate and rhythm. Abdomen:  +BS, soft, non-tender and non-distended. Umbilical hernia noted, No rebound or guarding. No HSM or masses noted. Negative Carnett's.  Msk:  Symmetrical without gross deformities. Normal posture. Extremities:  Bilateral lower extremity edema, left leg appears slightly more edematous than right, chronic in nature, venous stasis changes to left leg with 2 ulcers noted. Fibrinous tissue on smaller ulcer, closer to midline. Lower ulcer with scab.  Neurologic:  Alert and  oriented x4;  grossly normal neurologically. Skin:  Intact without significant lesions or rashes. Cervical Nodes:  No significant cervical adenopathy. Psych:  Alert and cooperative. Normal mood and affect.  

## 2011-02-13 NOTE — Assessment & Plan Note (Addendum)
DVT in remote past. Chronic coumadin. Needs Lovenox bridging.  Last dose of Coumadin December 16th.  Start Lovenox December 17. Continue through 20th. Last dose on the am of the 20th. Procedure 12/21.  Dosing: Lovenox 100 mg Clifford BID (1mg /kg)

## 2011-02-13 NOTE — Progress Notes (Signed)
Called in Rx because e-prescribe was down.

## 2011-02-13 NOTE — Assessment & Plan Note (Signed)
Taking Nexium daily; however, Dexilant worked best. Will be losing Medicaid the end of the month. At this time, can switch to Dexilant, as they provide patient assistance. #14 Nexium samples provided. Pt to request forms when needed for Dexilant. Otherwise, GERD controlled.

## 2011-02-13 NOTE — Assessment & Plan Note (Signed)
LLQ abdominal pain, worsened with movement. Question musculoskeletal component. No fever, chills. No worrisome signs. Last CT Dec 2011 as outlined above. Hold off on CT for now, proceed with colonoscopy as already outlined.

## 2011-02-13 NOTE — Patient Instructions (Signed)
We will be proceeding with a colonoscopy in the very near future. You will need to take Lovenox for several days prior to this procedure. We will give you those instructions once the date is set.  Continue Nexium daily. If you would like to switch to Dexilant, you can fill out the patient assistance forms once your insurance runs out.   Further recommendations to follow once procedure is completed.

## 2011-02-14 NOTE — Progress Notes (Signed)
Pt aware of the Lovenox bridge. Said someone reviewed with him when he was in the office. He will be expecting the call from Crystal. He is aware that the Lovenox has been sent to the pharmacy.

## 2011-02-14 NOTE — Progress Notes (Signed)
Routed to DS. This is a [facility] pt.

## 2011-02-15 NOTE — Progress Notes (Signed)
REVIEWED. AGREE. 

## 2011-02-15 NOTE — Progress Notes (Signed)
Mailed the instructions to him.

## 2011-02-16 NOTE — Progress Notes (Signed)
Cc to PCP 

## 2011-02-17 ENCOUNTER — Telehealth: Payer: Self-pay | Admitting: Gastroenterology

## 2011-02-17 NOTE — Telephone Encounter (Signed)
Pt called- he is having a lot of abdominal pain this morning- would like to have a CT scan- please advise

## 2011-02-17 NOTE — Telephone Encounter (Signed)
Please address with [facility] since she and Tobi Bastos have seen this patient and Tobi Bastos not here today.

## 2011-02-17 NOTE — Telephone Encounter (Signed)
Called and informed pt's wife. She said he had stepped out for a few minutes. She said she will have him go to the ED if his pain persist/worsens.

## 2011-02-17 NOTE — Telephone Encounter (Signed)
Please call pt. He had a CT last year that did not how a hernia. He had an U/S in OCT 2012 and he has gallstones. If he is having sever pain, he should go to the ED to be evaluated.

## 2011-02-17 NOTE — Telephone Encounter (Signed)
Called and spoke with pt. He said he had some worse pain last night, rated about a 6-7. Stinging pain. It is better today, and he rates it about 3-4 now. He is just wanted to rule out a hernia, and make sure he knows what is causing the pain. Please advise!

## 2011-02-21 ENCOUNTER — Ambulatory Visit: Payer: Medicaid Other | Admitting: Gastroenterology

## 2011-03-02 ENCOUNTER — Encounter (HOSPITAL_COMMUNITY): Payer: Self-pay | Admitting: Pharmacy Technician

## 2011-03-09 MED ORDER — SODIUM CHLORIDE 0.45 % IV SOLN
Freq: Once | INTRAVENOUS | Status: AC
  Administered 2011-03-10: 09:00:00 via INTRAVENOUS

## 2011-03-10 ENCOUNTER — Ambulatory Visit (HOSPITAL_COMMUNITY)
Admission: RE | Admit: 2011-03-10 | Discharge: 2011-03-10 | Disposition: A | Discharge status: Home / Self Care | Payer: Medicaid Other | Source: Ambulatory Visit | Attending: Gastroenterology | Admitting: Gastroenterology

## 2011-03-10 ENCOUNTER — Encounter (HOSPITAL_COMMUNITY): Payer: Self-pay

## 2011-03-10 ENCOUNTER — Encounter (HOSPITAL_COMMUNITY)
Admission: RE | Disposition: A | Discharge status: Home / Self Care | Payer: Self-pay | Source: Ambulatory Visit | Attending: Gastroenterology

## 2011-03-10 DIAGNOSIS — K648 Other hemorrhoids: Secondary | ICD-10-CM | POA: Insufficient documentation

## 2011-03-10 DIAGNOSIS — Z1211 Encounter for screening for malignant neoplasm of colon: Secondary | ICD-10-CM

## 2011-03-10 HISTORY — PX: COLONOSCOPY: SHX5424

## 2011-03-10 SURGERY — COLONOSCOPY
Anesthesia: Moderate Sedation

## 2011-03-10 MED ORDER — MIDAZOLAM HCL 5 MG/5ML IJ SOLN
INTRAMUSCULAR | Status: DC | PRN
  Administered 2011-03-10 (×2): 2 mg via INTRAVENOUS

## 2011-03-10 MED ORDER — ENOXAPARIN SODIUM 150 MG/ML ~~LOC~~ SOLN: 1.0000 mg/kg | Freq: Two times a day (BID) | SUBCUTANEOUS | Status: AC

## 2011-03-10 MED ORDER — MEPERIDINE HCL 100 MG/ML IJ SOLN
INTRAMUSCULAR | Status: AC
  Filled 2011-03-10: qty 1

## 2011-03-10 MED ORDER — MEPERIDINE HCL 100 MG/ML IJ SOLN
INTRAMUSCULAR | Status: DC | PRN
  Administered 2011-03-10 (×2): 25 mg via INTRAVENOUS

## 2011-03-10 MED ORDER — STERILE WATER FOR IRRIGATION IR SOLN
Status: DC | PRN
  Administered 2011-03-10: 10:00:00

## 2011-03-10 MED ORDER — MIDAZOLAM HCL 5 MG/5ML IJ SOLN
INTRAMUSCULAR | Status: AC
  Filled 2011-03-10: qty 10

## 2011-03-10 NOTE — Op Note (Addendum)
[facility] [address] [place], [place]  [address]  COLONOSCOPY PROCEDURE REPORT  PATIENT:  Jose, Farmer  MR#:  [id number] BIRTHDATE:  [birth year], 60 yrs. old  GENDER:  male  ENDOSCOPIST:  [name], MD REF. BY:  PATTY SMITH-OVERMA, NP-c ASSISTANT:  PROCEDURE DATE:  03/10/2011 PROCEDURE:  Colonoscopy 62952  INDICATIONS:  SCREENING  MEDICATIONS:   Demerol 50 mg IV, Versed 4 mg IV  DESCRIPTION OF PROCEDURE:    Physical exam was performed. Informed consent was obtained from the patient after explaining the benefits, risks, and alternatives to procedure.  The patient was connected to monitor and placed in left lateral position. Continuous oxygen was provided by nasal cannula and IV medicine administered through an indwelling cannula.  After administration of sedation and rectal exam, the patient's rectum was intubated and the EC-[id number] ([id number]) colonoscope was advanced under direct visualization to the cecum.  The scope was removed slowly by carefully examining the color, texture, anatomy, and integrity mucosa on the way out.  The patient was recovered in endoscopy and discharged home in satisfactory condition. <<PROCEDUREIMAGES>>  FINDINGS:  SMALL Internal Hemorrhoids were found. NO POLYPS, DIVERTICULA, OR AVMs.  PREP QUALITY: GOOD CECAL W/D TIME:     15 minutes  COMPLICATIONS:    None  ENDOSCOPIC IMPRESSION: 1) Internal hemorrhoids  RECOMMENDATIONS: TCS IN 10 YEARS HIGH FIBER DIET START COUMADIN TODAY LOVENOX 100 MG Stockwell BID FOR 3 DAYS.  REPEAT EXAM:  No  ______________________________ [name], MD  CC:  n. REVISED:  03/10/2011 11:16 AM eSIGNED:   Sandi Fields at 03/10/2011 11:16 AM  Rae Roam, [phone]

## 2011-03-10 NOTE — H&P (View-Only) (Signed)
Referring Provider: Kandice Robinsons, FNP Primary Care Physician:  Melynda Keller, NP Primary Gastroenterologist: Dr. Darrick Penna    Chief Complaint  Patient presents with  . 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 This was at the time of the EGD with Dr. Darrick Penna. He continues to take Coumadin.   At his last visit, he complained of LLQ abdominal pain, intermittent diarrhea and constipation. Diarrhea was a max of 2 loose stools per day. He denied melena or hematochezia. He was worried about celiac disease. Stool studies were done at that time, as well as celiac panel. Findings all WNL.   He presents now for an updated screening colonoscopy, last done in 1998. States has a BM every day. Trying to follow a high fiber diet. Taking probiotic daily. Continues to complain of LLQ abdominal pain, underlying, waxing and waning. Some days doesn't bother him. Usually worse if moving around. Denies fever or chills. No blood in stool.   Last CT Dec 2011:  Cholelithiasis.  No other significant intra abdominal or intra pelvic abnormalities.  Stomach within lower chest, by history suspect gastric pull-up.  Small umbilical hernia containing fat.     Past Medical History  Diagnosis Date  . GERD (gastroesophageal reflux disease)   . 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   . Gout   . S/P endoscopy Sept 2012    mild gastritis, negative biopsies, short esophagus, not candidate for reflux surgery or Bravo capsule placement  . S/P colonoscopy 1998    Dr. Elder Cyphers, normal    Past Surgical History  Procedure Date  . Appendectomy   . Hernia repair     ventral  . Hand surgery     tendons  .  Esophagogastroduodenoscopy 12/16/2010    Procedure: ESOPHAGOGASTRODUODENOSCOPY (EGD);  Surgeon: Arlyce Harman, MD;  Location: [facility];  Service: Endoscopy;  Laterality: N/A;  2:00  . Esophagectomy     ? per pt report    Current Outpatient Prescriptions  Medication Sig Dispense Refill  . allopurinol (ZYLOPRIM) 300 MG tablet Take 150 mg by mouth daily.       Marland Kitchen azelastine (ASTELIN) 137 MCG/SPRAY nasal spray Place 1 spray into the nose daily.       . fluticasone (VERAMYST) 27.5 MCG/SPRAY nasal spray Place 2 sprays into the nose daily.        . furosemide (LASIX) 40 MG tablet Take 40 mg by mouth daily.       . pantoprazole (PROTONIX) 40 MG tablet 1 PO 30 MINUTES BEFORE MEALS TWICE DAILY  60 tablet  11  . ranitidine (ZANTAC) 150 MG capsule Take 150 mg by mouth every evening.       . warfarin (COUMADIN) 5 MG tablet Take 5 mg by mouth daily. 7.5 mg on M, W, F. 5 mg on Tues, Thurs, Sat.        Allergies as of 02/13/2011 - Review Complete 02/13/2011  Allergen Reaction Noted  . Indocin Other (See Comments) 12/14/2010    Family History  Problem Relation Age of Onset  . Colon cancer Neg Hx     History   Social History  . Marital Status: Married    Spouse Name: N/A  Number of Children: N/A  . Years of Education: N/A   Occupational History  . disability    Social History Main Topics  . Smoking status: Never Smoker   . Smokeless tobacco: None  . Alcohol Use: No  . Drug Use: No  . Sexually Active: None   Other Topics Concern  . None   Social History Narrative  . None    Review of Systems: Gen: Denies fever, chills, anorexia. Denies fatigue, weakness, weight loss.  CV: Denies chest pain, palpitations, syncope, peripheral edema, and claudication. Resp: Denies dyspnea at rest, cough, wheezing, coughing up blood, and pleurisy. GI: Denies vomiting blood, jaundice, and fecal incontinence.   Denies dysphagia or odynophagia. Derm: Denies rash, itching, dry skin Psych: Denies  depression, anxiety, memory loss, confusion. No homicidal or suicidal ideation.  Heme: Denies bruising, bleeding, and enlarged lymph nodes.  Physical Exam: BP 135/80  Pulse 57  Temp(Src) 97.5 F (36.4 C) (Temporal)  Ht 5\' 7"  (1.702 m)  Wt 228 lb 3.2 oz (103.511 kg)  BMI 35.74 kg/m2 General:   Alert and oriented. No distress noted. Pleasant and cooperative.  Head:  Normocephalic and atraumatic. Eyes:  Conjuctiva clear without scleral icterus. Mouth:  Oral mucosa pink and moist. Good dentition. No lesions. Neck:  Supple, without mass or thyromegaly. Heart:  S1, S2 present without murmurs, rubs, or gallops. Regular rate and rhythm. Abdomen:  +BS, soft, non-tender and non-distended. Umbilical hernia noted, No rebound or guarding. No HSM or masses noted. Negative Carnett's.  Msk:  Symmetrical without gross deformities. Normal posture. Extremities:  Bilateral lower extremity edema, left leg appears slightly more edematous than right, chronic in nature, venous stasis changes to left leg with 2 ulcers noted. Fibrinous tissue on smaller ulcer, closer to midline. Lower ulcer with scab.  Neurologic:  Alert and  oriented x4;  grossly normal neurologically. Skin:  Intact without significant lesions or rashes. Cervical Nodes:  No significant cervical adenopathy. Psych:  Alert and cooperative. Normal mood and affect.

## 2011-03-10 NOTE — Interval H&P Note (Signed)
History and Physical Interval Note:  03/10/2011 10:10 AM  Jose Farmer  has presented today for surgery, with the diagnosis of screening TCS  The various methods of treatment have been discussed with the patient and family. After consideration of risks, benefits and other options for treatment, the patient has consented to  Procedure(s): COLONOSCOPY as a surgical intervention .  The patients' history has been reviewed, patient examined, no change in status, stable for surgery.  I have reviewed the patients' chart and labs.  Questions were answered to the patient's satisfaction.     [employer]

## 2011-03-23 ENCOUNTER — Encounter (HOSPITAL_COMMUNITY): Payer: Self-pay | Admitting: Gastroenterology

## 2014-07-22 ENCOUNTER — Other Ambulatory Visit: Payer: Self-pay | Admitting: *Deleted

## 2014-07-22 DIAGNOSIS — I872 Venous insufficiency (chronic) (peripheral): Secondary | ICD-10-CM

## 2014-09-09 ENCOUNTER — Encounter: Payer: Medicaid Other | Admitting: Vascular Surgery

## 2014-09-09 ENCOUNTER — Encounter (HOSPITAL_COMMUNITY): Payer: Medicaid Other

## 2015-04-07 ENCOUNTER — Ambulatory Visit (HOSPITAL_COMMUNITY): Payer: Medicare Other | Admitting: Physical Therapy

## 2015-04-08 ENCOUNTER — Ambulatory Visit (HOSPITAL_COMMUNITY)
Admission: RE | Admit: 2015-04-08 | Discharge: 2015-04-08 | Disposition: A | Discharge status: Home / Self Care | Payer: Medicare Other | Source: Ambulatory Visit | Attending: Vascular Surgery | Admitting: Vascular Surgery

## 2015-04-08 ENCOUNTER — Other Ambulatory Visit (HOSPITAL_COMMUNITY): Payer: Self-pay | Admitting: Family Medicine

## 2015-04-08 DIAGNOSIS — M79605 Pain in left leg: Secondary | ICD-10-CM | POA: Diagnosis not present

## 2015-04-09 ENCOUNTER — Ambulatory Visit (HOSPITAL_COMMUNITY): Payer: Medicare Other | Attending: Family Medicine | Admitting: Physical Therapy

## 2015-04-09 DIAGNOSIS — X58XXXA Exposure to other specified factors, initial encounter: Secondary | ICD-10-CM | POA: Diagnosis not present

## 2015-04-09 DIAGNOSIS — T148XXA Other injury of unspecified body region, initial encounter: Secondary | ICD-10-CM

## 2015-04-09 DIAGNOSIS — R609 Edema, unspecified: Secondary | ICD-10-CM | POA: Diagnosis present

## 2015-04-09 DIAGNOSIS — T148 Other injury of unspecified body region: Secondary | ICD-10-CM | POA: Insufficient documentation

## 2015-04-09 NOTE — Therapy (Signed)
[facility] [address] [place], [place], [address] Phone: [phone]   Fax:  [phone]  Physical Therapy Evaluation  Patient Details  Name: Jose Farmer MRN: [id number] Date of Birth: [birth year] Referring Provider: Francene Boyers  Encounter Date: 04/09/2015      PT End of Session - 04/09/15 1014    Visit Number 1   Number of Visits 12   Date for PT Re-Evaluation 05/09/15   Authorization Type UHC medicare   Authorization - Visit Number 1   Authorization - Number of Visits 10   PT Start Time [id number]   PT Stop Time 0940   PT Time Calculation (min) 50 min   Activity Tolerance Patient tolerated treatment well   Behavior During Therapy [facility] for tasks assessed/performed      Past Medical History  Diagnosis Date  . GERD (gastroesophageal reflux disease)   . 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   . Gout   . S/P endoscopy Sept 2012    mild gastritis, negative biopsies, short esophagus, not candidate for reflux surgery or Bravo capsule placement  . S/P colonoscopy 1998    Dr. Elder Cyphers, normal    Past Surgical History  Procedure Laterality Date  . Appendectomy    . Hernia repair      ventral  . Hand surgery      tendons  . Esophagogastroduodenoscopy  12/16/2010    Procedure: ESOPHAGOGASTRODUODENOSCOPY (EGD);  Surgeon: Arlyce Harman, MD;  [facility];  Service: Endoscopy;  Laterality: N/A;  2:00  . Esophagectomy      ? per pt report  . Colonoscopy  03/10/2011    Procedure: COLONOSCOPY;  Surgeon: Arlyce Harman, MD;  [facility];  Service: Endoscopy;  Laterality: N/A;  10:00    There were no vitals filed for this visit.  Visit Diagnosis:  Nonhealing nonsurgical wound  Edema, unspecified type      Subjective Assessment - 04/09/15 1015    Subjective Jose Farmer states that he has had progressive swelling in both legs for the past two years.  He  states that he has compression hose that he is unable to don so he normally wears TED hose.  He is on fluid pills but it does not seem to help.  He states that he gets cellulitis about 5 to 6 times a year.  He has been referred to physical therapy due no multiple nonhealing wounds located on he Lt LE.  He has been given a prescription for antibiotics but states that he has not filled it due to wanting to see what was going to be done here first.      Pertinent History Hx of DVT.     How long can you sit comfortably? Pt states that he is able to sit as long as he wants.  He states that initially raising his legs made him feel better but now his legs feel better if they are down.    How long can you stand comfortably? 5 minutes   How long can you walk comfortably? less than five minues    Currently in Pain? Yes   Pain Score 8    Pain Location Leg   Pain Orientation Left   Pain Descriptors / Indicators Aching   Pain Type Chronic pain   Pain Onset More than a month ago   Aggravating Factors  activity   Pain  Relieving Factors unsure            [facility] PT Assessment - 04/09/15 0001    Assessment   Medical Diagnosis Non-healing wound Lt LE; edema B LE    Referring Provider Francene Boyers   Onset Date/Surgical Date 04/06/15   Hand Dominance Right   Next MD Visit next month   Prior Therapy antibiotic both oral and topical    Precautions   Precautions Other (comment)   Precaution Comments Cellulitis; caution on how much compression is used as pt has not had an ABI and does have arterial insufficiency symptoms.    Restrictions   Weight Bearing Restrictions No   Balance Screen   Has the patient fallen in the past 6 months No   Has the patient had a decrease in activity level because of a fear of falling?  No   Is the patient reluctant to leave their home because of a fear of falling?  No   Home [employer] information [employer] residence   Prior Function   Level of Independence  Independent   Vocation On disability   Leisure no significant hobbies    Cognition   Overall Cognitive Status Within Functional Limits for tasks assessed   Observation/Other Assessments   Other Surveys  Other Surveys  life impact score 46   Observation/Other Assessments-Edema    Edema Circumferential  Pt has over 11 small wounds scattered on his LE.        Date 04/09/2015 04/09/2015   Rt LT   MTP 27.00 28  ankle 31.50 30.20  4cm 31.70 29.00  8cm 34.20 34.30  12 cm 38.00 43.70  16cm 40.40 50.00  20cm 42.00 51.00  24cm 44.00 49.30  28cm 44.70 47.30  32cm 43.00 43.50  36cm    40cm    44cm    48cm                        Sum of squares 14519.03 17284.25  Total Volume 4621.5524 5501.7496                [facility] Adult PT Treatment/Exercise - 04/09/15 0001    Manual Therapy   Manual Therapy Edema management;Compression Bandaging   Manual therapy comments gentle retro massage    Compression Bandaging profore lite with extra cotton at ankle                 PT Education - 04/09/15 1016    Education provided Yes   Education Details Pt given information on lymphedem, order sheet for short stretch bandages, exercise to promote lymph circulation  and educated about the difference between TED and compression garments.  Pt instructed to take dressing off if pain in LE increased.    Person(s) Educated Patient   Methods Explanation;Handout   Comprehension Verbalized understanding          PT Short Term Goals - 04/09/15 1211    PT SHORT TERM GOAL #1   Title Pt to be able to identify signs and symptoms of cellulitis in order to reduce risk of global infection   Time 2   Period Weeks   PT SHORT TERM GOAL #2   Title Pt to be independent in HEP in order to promote lymph circlation for decreased edema   Time 2   Period Weeks   PT SHORT TERM GOAL #3   Title Pt to have five or fewer wounds to decrease risk of infection  Time 2   Period Weeks   PT SHORT TERM  GOAL #4   Title Pt to have a 15% reduction of fluid to improve ability to don socks and shoes.            PT Long Term Goals - Apr 16, 2015 1214    PT LONG TERM GOAL #1   Title Pt to have no wounds in order to reduce risk of infection    Time 4   Period Weeks   PT LONG TERM GOAL #2   Title Pt to be independent with donning of compression garment  in order to keep edema under control    Time 4   Period Weeks   PT LONG TERM GOAL #3   Title Pt to have a reduction of 30% fluid in LE in order to decrease risk of cellulitis   Time 4   Period Weeks   PT LONG TERM GOAL #4   Title Pt life impact score to be below 40 for improved quality of life                Plan - 2015-04-16 1018    Clinical Impression Statement Jose Farmer has been referred to skilled PT for 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  Upon examination pt has edema in B LE with increased induration.  He has objective signs and symptoms of venous insufficiency including varicositis.  Jose Farmer has objective sx of lymphedema including induration of B LE, and hx of chronic cellulitis. This is complicated by subjective symptoms of possible arterial insufficiency.   Today his Lt LE is red , painful and swollen he was encouraged to fill the prescription for antibiotics that he was given and start taking them.  Therapist has sent a prescription to the MD to begin treatment B for  lymphedema and to continue with wound care.   Jose Farmer has not had an ABI that he knows of and due to his stating that his legs feel better in a dependent position we will be cautious with compression.    Pt will benefit from skilled therapeutic intervention in order to improve on the following deficits Pain;Other (comment);Increased edema  non healing wounds    Rehab Potential Good   PT Frequency 3x / week   PT Duration 4 weeks   PT Treatment/Interventions Manual techniques;Manual lymph drainage;Compression bandaging;Patient/family education   PT Next  Visit Plan Begin manual lymph drainage if order has been recieved   PT Home Exercise Plan given          G-Codes - Apr 16, 2015 Jul 27, 1216    Functional Assessment Tool Used clinical judgement of numerous wounds as welll as life impact score    Functional Limitation Other PT primary   Other PT Primary Current Status (Z6109) At least 40 percent but less than 60 percent impaired, limited or restricted   Other PT Primary Goal Status (U0454) At least 20 percent but less than 40 percent impaired, limited or restricted       Problem List Patient Active Problem List   Diagnosis Date Noted  . Screening for colon cancer 02/13/2011  . Lower abdominal pain 01/31/2011  . GERD (gastroesophageal reflux disease) 12/13/2010  . Warfarin anticoagulation 12/13/2010  . Encounter for screening colonoscopy 12/13/2010    Virgina Organ, PT CLT [phone] April 16, 2015, 12:19 PM  [facility] [address] [place], [place], [address] Phone: [phone]   Fax:  [phone]  Name: Jose Farmer MRN:  [id number] Date of Birth: [birth year]

## 2015-04-13 ENCOUNTER — Ambulatory Visit (HOSPITAL_COMMUNITY): Payer: Medicare Other

## 2015-04-13 ENCOUNTER — Ambulatory Visit (HOSPITAL_COMMUNITY): Payer: Medicare Other | Admitting: Physical Therapy

## 2015-04-13 DIAGNOSIS — R609 Edema, unspecified: Secondary | ICD-10-CM

## 2015-04-13 DIAGNOSIS — T148XXA Other injury of unspecified body region, initial encounter: Secondary | ICD-10-CM

## 2015-04-13 DIAGNOSIS — T148 Other injury of unspecified body region: Secondary | ICD-10-CM | POA: Diagnosis not present

## 2015-04-13 NOTE — Therapy (Signed)
[facility] [address] [place], [place], [address] Phone: [phone]   Fax:  [phone]  Physical Therapy Treatment  Patient Details  Name: Jose Farmer MRN: [id number] Date of Birth: 07-Jun-1950 Referring Provider: Francene Boyers  Encounter Date: 04/13/2015      PT End of Session - 04/13/15 1346    Visit Number 2   Number of Visits 12   Date for PT Re-Evaluation 05/09/15   Authorization Type UHC medicare   Authorization - Visit Number 2   Authorization - Number of Visits 10   PT Start Time 1302   PT Stop Time 1345   PT Time Calculation (min) 43 min   Activity Tolerance Patient tolerated treatment well      Past Medical History  Diagnosis Date  . GERD (gastroesophageal reflux disease)   . 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   . Gout   . S/P endoscopy Sept 2012    mild gastritis, negative biopsies, short esophagus, not candidate for reflux surgery or Bravo capsule placement  . S/P colonoscopy 1998    Dr. Elder Cyphers, normal    Past Surgical History  Procedure Laterality Date  . Appendectomy    . Hernia repair      ventral  . Hand surgery      tendons  . Esophagogastroduodenoscopy  12/16/2010    Procedure: ESOPHAGOGASTRODUODENOSCOPY (EGD);  Surgeon: Arlyce Harman, MD;  [facility];  Service: Endoscopy;  Laterality: N/A;  2:00  . Esophagectomy      ? per pt report  . Colonoscopy  03/10/2011    Procedure: COLONOSCOPY;  Surgeon: Arlyce Harman, MD;  [facility];  Service: Endoscopy;  Laterality: N/A;  10:00    There were no vitals filed for this visit.  Visit Diagnosis:  Nonhealing nonsurgical wound  Edema, unspecified type      Subjective Assessment - 04/13/15 1344    Subjective Mr. Hunnell states that he has no pain.  States that he had no difficulty with the dressing.  Pt states that he is unable to afford the short stretch bandages  at this time.     Pertinent History Hx of DVT.     Currently in Pain? No/denies   Pain Score 0-No pain              [facility] Adult PT Treatment/Exercise - 04/13/15 0001    Manual Therapy   Manual Therapy Edema management;Compression Bandaging   Manual therapy comments gentle retro massage    Compression Bandaging profore lite with extra cotton at ankle               PT Short Term Goals - 04/13/15 1349    PT SHORT TERM GOAL #1   Title Pt to be able to identify signs and symptoms of cellulitis in order to reduce risk of global infection   Time 2   Period Weeks   PT SHORT TERM GOAL #2   Title Pt to be independent in HEP in order to promote lymph circlation for decreased edema   Time 2   Period Weeks   PT SHORT TERM GOAL #3   Title Pt to have five or fewer wounds to decrease risk of infection    Time 2   Period Weeks   PT SHORT TERM GOAL #4   Title Pt to have a 15% reduction of fluid to improve ability  to don socks and shoes.            PT Long Term Goals - 04/13/15 1350    PT LONG TERM GOAL #1   Title Pt to have no wounds in order to reduce risk of infection    Time 4   Period Weeks   PT LONG TERM GOAL #2   Title Pt to be independent with donning of compression garment  in order to keep edema under control    Time 4   Period Weeks   PT LONG TERM GOAL #3   Title Pt to have a reduction of 30% fluid in LE in order to decrease risk of cellulitis   Time 4   Period Weeks   PT LONG TERM GOAL #4   Title Pt life impact score to be below 40 for improved quality of life                Plan - 04/13/15 1347    Clinical Impression Statement Pain has gone down from an 8 last treatment to a 0 this treatment.  Referral for lymphedema treatment has not been recieved.  Continue with wound care with cleansing, moisturizing, minimal debridement of dead skin followed by xeroform to open wounds.  Manual completed to decrease edema in Lt foot and LE.  Lt LE was then  bandaged with profore lite multilayer compression bandage system.     PT Treatment/Interventions Manual techniques;Manual lymph drainage;Compression bandaging;Patient/family education   PT Next Visit Plan Begin manual lymph drainage if order has been recieved. Remeasure for volume.         Problem List Patient Active Problem List   Diagnosis Date Noted  . Screening for colon cancer 02/13/2011  . Lower abdominal pain 01/31/2011  . GERD (gastroesophageal reflux disease) 12/13/2010  . Warfarin anticoagulation 12/13/2010  . Encounter for screening colonoscopy 12/13/2010    Virgina Farmer, PT CLT [phone] 04/13/2015, 1:51 PM  [facility] [address] [place], [place], [address] Phone: [phone]   Fax:  [phone]  Name: Jose Farmer MRN: [id number] Date of Birth: Jan 27, 1951

## 2015-04-15 ENCOUNTER — Ambulatory Visit (HOSPITAL_COMMUNITY): Payer: Medicare Other | Admitting: Physical Therapy

## 2015-04-15 DIAGNOSIS — T148 Other injury of unspecified body region: Secondary | ICD-10-CM | POA: Diagnosis not present

## 2015-04-15 DIAGNOSIS — T148XXA Other injury of unspecified body region, initial encounter: Secondary | ICD-10-CM

## 2015-04-15 DIAGNOSIS — R609 Edema, unspecified: Secondary | ICD-10-CM

## 2015-04-15 NOTE — Therapy (Signed)
[facility] [address] [place], [place], [address] Phone: [phone]   Fax:  [phone]  Physical Therapy Treatment  Patient Details  Name: Jose Farmer MRN: [id number] Date of Birth: 1951/02/14 Referring Provider: Francene Boyers  Encounter Date: 04/15/2015      PT End of Session - 04/15/15 1517    Visit Number 3   Number of Visits 12   Date for PT Re-Evaluation 05/09/15   Authorization Type UHC medicare   Authorization - Visit Number 3   Authorization - Number of Visits 10   PT Start Time 1430   PT Stop Time 1510   PT Time Calculation (min) 40 min   Activity Tolerance Patient tolerated treatment well   Behavior During Therapy [facility] for tasks assessed/performed      Past Medical History  Diagnosis Date  . GERD (gastroesophageal reflux disease)   . 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   . Gout   . S/P endoscopy Sept 2012    mild gastritis, negative biopsies, short esophagus, not candidate for reflux surgery or Bravo capsule placement  . S/P colonoscopy 1998    Dr. Elder Cyphers, normal    Past Surgical History  Procedure Laterality Date  . Appendectomy    . Hernia repair      ventral  . Hand surgery      tendons  . Esophagogastroduodenoscopy  12/16/2010    Procedure: ESOPHAGOGASTRODUODENOSCOPY (EGD);  Surgeon: Arlyce Harman, MD;  [facility];  Service: Endoscopy;  Laterality: N/A;  2:00  . Esophagectomy      ? per pt report  . Colonoscopy  03/10/2011    Procedure: COLONOSCOPY;  Surgeon: Arlyce Harman, MD;  [facility];  Service: Endoscopy;  Laterality: N/A;  10:00    There were no vitals filed for this visit.  Visit Diagnosis:  Nonhealing nonsurgical wound  Edema, unspecified type      Subjective Assessment - 04/15/15 1820    Subjective PT states his legs are feeling good and has no complaints.  Dressings are intact.   Currently in  Pain? No/denies                         [facility] Adult PT Treatment/Exercise - 04/15/15 0001    Manual Therapy   Manual Therapy Edema management;Compression Bandaging   Manual therapy comments gentle retro massage    Compression Bandaging profore lite with extra cotton at ankle         Date 04/09/2015  04/15/2015   Rt Lt Lt  MTP 27.00 28.00 27  ankle 31.5 30.2 32.30  4cm 31.70 29.00 29.50  8cm 34.20 34.30 35.20  12 cm 38.00 43.70 42.00  16cm 40.40 50.00 45.60  20cm 42.00 51.00 47.80  24cm 44.00 49.30 47.00  28cm 44.70 47.30 45.40  32cm 43.00 43.50 43.20                                               Sum of squares 14519.03 17284.25 16146.18  Total Volume 4621.552 5501.75 5139.491             PT Education - 04/15/15 1517    Education provided Yes   Education Details given copy of initial evaluation with explanation  of goals    Person(s) Educated Patient   Methods Explanation;Handout   Comprehension Verbalized understanding          PT Short Term Goals - 04/13/15 1349    PT SHORT TERM GOAL #1   Title Pt to be able to identify signs and symptoms of cellulitis in order to reduce risk of global infection   Time 2   Period Weeks   PT SHORT TERM GOAL #2   Title Pt to be independent in HEP in order to promote lymph circlation for decreased edema   Time 2   Period Weeks   PT SHORT TERM GOAL #3   Title Pt to have five or fewer wounds to decrease risk of infection    Time 2   Period Weeks   PT SHORT TERM GOAL #4   Title Pt to have a 15% reduction of fluid to improve ability to don socks and shoes.            PT Long Term Goals - 04/13/15 1350    PT LONG TERM GOAL #1   Title Pt to have no wounds in order to reduce risk of infection    Time 4   Period Weeks   PT LONG TERM GOAL #2   Title Pt to be independent with donning of compression garment  in order to keep edema under control    Time 4   Period Weeks   PT LONG TERM GOAL #3    Title Pt to have a reduction of 30% fluid in LE in order to decrease risk of cellulitis   Time 4   Period Weeks   PT LONG TERM GOAL #4   Title Pt life impact score to be below 40 for improved quality of life                Plan - 04/15/15 1518    Clinical Impression Statement Referral for lymphedema still not received.  Patient given intial evaluation with explanation of measurements and goals.  Lt LE remeasured today with reduction of 363.3 cc as compared to intial evaluation last week. Used toe bandages today as noted induration present.  LE's cleansed well and liberal amount of vaseline applied prior to rebandaging as patient reported increased itching and dry skin.  Wounds are nearly healed without slough present.  Pt reported overall comfort with bandages at end of session .   PT Treatment/Interventions Manual techniques;Manual lymph drainage;Compression bandaging;Patient/family education   PT Next Visit Plan Begin manual lymph drainage if order has been recieved. Continue woundcare and follow up on effectiveness of toe bandages.          Problem List Patient Active Problem List   Diagnosis Date Noted  . Screening for colon cancer 02/13/2011  . Lower abdominal pain 01/31/2011  . GERD (gastroesophageal reflux disease) 12/13/2010  . Warfarin anticoagulation 12/13/2010  . Encounter for screening colonoscopy 12/13/2010    Lurena Nida, PTA/CLT [phone]  04/15/2015, 6:23 PM  [facility] [address] [place], [place], [address] Phone: [phone]   Fax:  [phone]  Name: Jose Farmer MRN: [id number] Date of Birth: 1950/06/23

## 2015-04-19 ENCOUNTER — Ambulatory Visit (HOSPITAL_COMMUNITY): Payer: Medicare Other | Admitting: Physical Therapy

## 2015-04-19 DIAGNOSIS — T148 Other injury of unspecified body region: Secondary | ICD-10-CM | POA: Diagnosis not present

## 2015-04-19 DIAGNOSIS — R609 Edema, unspecified: Secondary | ICD-10-CM

## 2015-04-19 DIAGNOSIS — T148XXA Other injury of unspecified body region, initial encounter: Secondary | ICD-10-CM

## 2015-04-19 NOTE — Therapy (Addendum)
[facility] [address] [place], [place], [address] Phone: [phone]   Fax:  [phone]  Physical Therapy Treatment  Patient Details  Name: Jose Farmer MRN: [id number] Date of Birth: [birth year] Referring Provider: Francene Boyers  Encounter Date: 04/19/2015      PT End of Session - 04/19/15 1234    Visit Number 4   Number of Visits 12   Date for PT Re-Evaluation 05/09/15   Authorization - Visit Number 4   Authorization - Number of Visits 10   PT Start Time 1150   PT Stop Time 1230   PT Time Calculation (min) 40 min   Activity Tolerance Patient tolerated treatment well      Past Medical History  Diagnosis Date  . GERD (gastroesophageal reflux disease)   . 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   . Gout   . S/P endoscopy Sept 2012    mild gastritis, negative biopsies, short esophagus, not candidate for reflux surgery or Bravo capsule placement  . S/P colonoscopy 1998    Dr. Elder Cyphers, normal    Past Surgical History  Procedure Laterality Date  . Appendectomy    . Hernia repair      ventral  . Hand surgery      tendons  . Esophagogastroduodenoscopy  12/16/2010    Procedure: ESOPHAGOGASTRODUODENOSCOPY (EGD);  Surgeon: Arlyce Harman, MD;  [facility];  Service: Endoscopy;  Laterality: N/A;  2:00  . Esophagectomy      ? per pt report  . Colonoscopy  03/10/2011    Procedure: COLONOSCOPY;  Surgeon: Arlyce Harman, MD;  [facility];  Service: Endoscopy;  Laterality: N/A;  10:00    There were no vitals filed for this visit.  Visit Diagnosis:  Nonhealing nonsurgical wound  Edema, unspecified type      Subjective Assessment - 04/19/15 1230    Subjective Pt states that his legs continue to get smaller; dressings felt comfortable and pain is decreasing.  States that he took his last anitbiotic today.    Currently in Pain? Yes   Pain Score 2    Pain Location Leg   Pain Orientation Right  compression sock is uncomfortable.    Pain Descriptors / Indicators Aching   Pain Onset In the past 7 days   Pain Frequency Intermittent   Aggravating Factors  activity    Pain Relieving Factors taking the compression sock off.                          [facility] Adult PT Treatment/Exercise - 04/19/15 0001    Manual Therapy   Manual Therapy Manual Lymphatic Drainage (MLD);Compression Bandaging   Manual Lymphatic Drainage (MLD) including supraclavicular, deep and superfical abdominal, routing fluid using inguinal/axillary anastomosis, and anterior aspect of Lt only due to time restraints.    Compression Bandaging profore compression bandaging with toe wraps and extra cotton at ankle.  Xeroform placed over wounds prior to application of profore bandaging system.                   PT Short Term Goals - 04/13/15 1349    PT SHORT TERM GOAL #1   Title Pt to be able to identify signs and symptoms of cellulitis in order to reduce risk of global infection   Time 2   Period Weeks   PT SHORT  TERM GOAL #2   Title Pt to be independent in HEP in order to promote lymph circlation for decreased edema   Time 2   Period Weeks   PT SHORT TERM GOAL #3   Title Pt to have five or fewer wounds to decrease risk of infection    Time 2   Period Weeks   PT SHORT TERM GOAL #4   Title Pt to have a 15% reduction of fluid to improve ability to don socks and shoes.            PT Long Term Goals - 04/13/15 1350    PT LONG TERM GOAL #1   Title Pt to have no wounds in order to reduce risk of infection    Time 4   Period Weeks   PT LONG TERM GOAL #2   Title Pt to be independent with donning of compression garment  in order to keep edema under control    Time 4   Period Weeks   PT LONG TERM GOAL #3   Title Pt to have a reduction of 30% fluid in LE in order to decrease risk of cellulitis   Time 4   Period Weeks   PT LONG TERM GOAL #4    Title Pt life impact score to be below 40 for improved quality of life                Plan - 04/19/15 1235    Clinical Impression Statement Recieved referral for lymphedema.  Wounds are almost all healed.  Two small wounds in posterior calf  but anterior and lateral wounds are healed.  Pt instructed to bring his other compression stocking in next visit.   Pt instructed in the benefit of diaphraghmic breathing in lymphedema.     PT Next Visit Plan If all wounds are healed use compression stocking rather than profore.  Remeasure volume and review self massaging techniques with pt.  Pt may be ready for discharge earlly next week.         Problem List Patient Active Problem List   Diagnosis Date Noted  . Screening for colon cancer 02/13/2011  . Lower abdominal pain 01/31/2011  . GERD (gastroesophageal reflux disease) 12/13/2010  . Warfarin anticoagulation 12/13/2010  . Encounter for screening colonoscopy 12/13/2010   Virgina Organ, PT CLT [phone] 04/19/2015, 12:39 PM  [facility] [address] [place], [place], [address] Phone: [phone]   Fax:  [phone]  Name: Jose Farmer MRN: [id number] Date of Birth: 1951/03/17

## 2015-04-21 ENCOUNTER — Ambulatory Visit (HOSPITAL_COMMUNITY): Payer: Medicare Other | Attending: Family Medicine | Admitting: Physical Therapy

## 2015-04-21 DIAGNOSIS — R609 Edema, unspecified: Secondary | ICD-10-CM | POA: Insufficient documentation

## 2015-04-21 DIAGNOSIS — T148XXA Other injury of unspecified body region, initial encounter: Secondary | ICD-10-CM

## 2015-04-21 DIAGNOSIS — T148 Other injury of unspecified body region: Secondary | ICD-10-CM | POA: Diagnosis not present

## 2015-04-21 NOTE — Therapy (Signed)
[facility] [address] [place], [place], [address] Phone: [phone]   Fax:  [phone]  Physical Therapy Treatment  Patient Details  Name: Jose Farmer MRN: [id number] Date of Birth: 20-Apr-1950 Referring Provider: Francene Boyers  Encounter Date: 04/21/2015      PT End of Session - 04/21/15 1631    Visit Number 5   Number of Visits 12   Date for PT Re-Evaluation 05/09/15   Authorization Type UHC medicare   Authorization - Visit Number 5   Authorization - Number of Visits 10   PT Start Time [phone]   PT Stop Time 1015   PT Time Calculation (min) 39 min   Activity Tolerance Patient tolerated treatment well   Behavior During Therapy [facility] for tasks assessed/performed      Past Medical History  Diagnosis Date  . GERD (gastroesophageal reflux disease)   . 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   . Gout   . S/P endoscopy Sept 2012    mild gastritis, negative biopsies, short esophagus, not candidate for reflux surgery or Bravo capsule placement  . S/P colonoscopy 1998    Dr. Elder Cyphers, normal    Past Surgical History  Procedure Laterality Date  . Appendectomy    . Hernia repair      ventral  . Hand surgery      tendons  . Esophagogastroduodenoscopy  12/16/2010    Procedure: ESOPHAGOGASTRODUODENOSCOPY (EGD);  Surgeon: Arlyce Harman, MD;  [facility];  Service: Endoscopy;  Laterality: N/A;  2:00  . Esophagectomy      ? per pt report  . Colonoscopy  03/10/2011    Procedure: COLONOSCOPY;  Surgeon: Arlyce Harman, MD;  [facility];  Service: Endoscopy;  Laterality: N/A;  10:00    There were no vitals filed for this visit.  Visit Diagnosis:  Nonhealing nonsurgical wound  Edema, unspecified type      Subjective Assessment - 04/21/15 1636    Subjective Pt states his toe bandages stayed on a few days.  STates his legs are feeling better, no pain.   Currently in Pain? No/denies                         [facility] Adult PT Treatment/Exercise - 04/21/15 1630    Manual Therapy   Manual Therapy Manual Lymphatic Drainage (MLD);Compression Bandaging   Manual Lymphatic Drainage (MLD) including supraclavicular, deep and superfical abdominal, routing fluid using inguinal/axillary anastomosis, and anterior aspect of Lt only due to time restraints.    Compression Bandaging profore compression bandaging with toe wraps and extra cotton at ankle.                   PT Short Term Goals - 04/13/15 1349    PT SHORT TERM GOAL #1   Title Pt to be able to identify signs and symptoms of cellulitis in order to reduce risk of global infection   Time 2   Period Weeks   PT SHORT TERM GOAL #2   Title Pt to be independent in HEP in order to promote lymph circlation for decreased edema   Time 2   Period Weeks   PT SHORT TERM GOAL #3   Title Pt to have five or fewer wounds to decrease risk of infection    Time 2   Period Weeks   PT SHORT TERM  GOAL #4   Title Pt to have a 15% reduction of fluid to improve ability to don socks and shoes.            PT Long Term Goals - 04/13/15 1350    PT LONG TERM GOAL #1   Title Pt to have no wounds in order to reduce risk of infection    Time 4   Period Weeks   PT LONG TERM GOAL #2   Title Pt to be independent with donning of compression garment  in order to keep edema under control    Time 4   Period Weeks   PT LONG TERM GOAL #3   Title Pt to have a reduction of 30% fluid in LE in order to decrease risk of cellulitis   Time 4   Period Weeks   PT LONG TERM GOAL #4   Title Pt life impact score to be below 40 for improved quality of life                Plan - 04/21/15 1631    Clinical Impression Statement All wounds healed except small approx 0.2cm2 on Lt lateral LE.  Pt did bring his compression sock with him today, unsure strength.  debrided slough from woundbed, completed lymph  drainage, cleansed Lt LE and moisturized prior to redressing with profore bandage system.  PT tolerated well and reported comfort at end of session.  Toe bandages were also applied.    PT Next Visit Plan If all wounds are healed use compression stocking rather than profore.  Remeasure volume and review self massaging techniques with pt at next appt.  Anticipate Pt may be ready for discharge at this time.        Problem List Patient Active Problem List   Diagnosis Date Noted  . Screening for colon cancer 02/13/2011  . Lower abdominal pain 01/31/2011  . GERD (gastroesophageal reflux disease) 12/13/2010  . Warfarin anticoagulation 12/13/2010  . Encounter for screening colonoscopy 12/13/2010    Jose Farmer, PTA/CLT [phone]  04/21/2015, 4:37 PM  [facility] [address] [place], [place], [address] Phone: [phone]   Fax:  [phone]  Name: Jose Farmer MRN: [id number] Date of Birth: Aug 31, 1950

## 2015-04-23 ENCOUNTER — Encounter (HOSPITAL_COMMUNITY): Payer: Medicare Other | Admitting: Physical Therapy

## 2015-04-26 ENCOUNTER — Encounter (HOSPITAL_COMMUNITY): Payer: Medicare Other | Admitting: Physical Therapy

## 2015-04-28 ENCOUNTER — Ambulatory Visit (HOSPITAL_COMMUNITY): Payer: Medicare Other | Admitting: Physical Therapy

## 2015-04-28 DIAGNOSIS — R609 Edema, unspecified: Secondary | ICD-10-CM

## 2015-04-28 DIAGNOSIS — T148XXA Other injury of unspecified body region, initial encounter: Secondary | ICD-10-CM

## 2015-04-28 DIAGNOSIS — T148 Other injury of unspecified body region: Secondary | ICD-10-CM | POA: Diagnosis not present

## 2015-04-28 NOTE — Therapy (Signed)
[facility] [address] [place], [place], [address] Phone: [phone]   Fax:  [phone]  Physical Therapy Treatment  Patient Details  Name: Jose Farmer MRN: [id number] Date of Birth: 1950/07/11 Referring Provider: Holly Bodily  Encounter Date: 04/28/2015      PT End of Session - 04/28/15 1017    Visit Number 6   Number of Visits 12   Date for PT Re-Evaluation 05/09/15   Authorization Type UHC medicare   Authorization - Visit Number 6   Authorization - Number of Visits 10   PT Start Time 0935   PT Stop Time 1013   PT Time Calculation (min) 38 min   Activity Tolerance Patient tolerated treatment well   Behavior During Therapy [facility] for tasks assessed/performed      Past Medical History  Diagnosis Date  . GERD (gastroesophageal reflux disease)   . 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   . Gout   . S/P endoscopy Sept 2012    mild gastritis, negative biopsies, short esophagus, not candidate for reflux surgery or Bravo capsule placement  . S/P colonoscopy 1998    Dr. Algis Greenhouse, normal    Past Surgical History  Procedure Laterality Date  . Appendectomy    . Hernia repair      ventral  . Hand surgery      tendons  . Esophagogastroduodenoscopy  12/16/2010    Procedure: ESOPHAGOGASTRODUODENOSCOPY (EGD);  Surgeon: Dorothyann Peng, MD;  [facility];  Service: Endoscopy;  Laterality: N/A;  2:00  . Esophagectomy      ? per pt report  . Colonoscopy  03/10/2011    Procedure: COLONOSCOPY;  Surgeon: Dorothyann Peng, MD;  [facility];  Service: Endoscopy;  Laterality: N/A;  10:00    There were no vitals filed for this visit.  Visit Diagnosis:  Nonhealing nonsurgical wound  Edema, unspecified type              [facility] Adult PT Treatment/Exercise - 04/28/15 1014    Manual Therapy   Manual Therapy Compression Bandaging;Other (comment)   Compression Bandaging application and education of compression garment   Other Manual Therapy Circumferential measurements of Lt LE, cleansing of LE with education          Date 04/09/2015  04/15/2015 8-Feb   Rt Lt Lt Lt  MTP 27.00 28.00 27 25.2  ankle 31.5 30.2 32.30 28.70  4cm 31.70 29.00 29.50 26.20  8cm 34.20 34.30 35.20 31.40  12 cm 38.00 43.70 42.00 37.50  16cm 40.40 50.00 45.60 41.30  20cm 42.00 51.00 47.80 42.40  24cm 44.00 49.30 47.00 43.00  28cm 44.70 47.30 45.40 42.00  32cm 43.00 43.50 43.20 41.60              Sum of squares 14519.03 17284.25 16146.18 13384.39  Total Volume 4621.552 5501.75 5139.491 4260.385            PT Education - 04/28/15 1030    Education provided Yes   Education Details importance of wearing garment everyday, removal at night to wash (do not dry) and moisturize LE.  Self massage techniques and basic skin care.   Person(s) Educated Patient   Methods Explanation;Demonstration   Comprehension Verbalized understanding;Returned demonstration          PT Short Term Goals - 04/28/15 1031    PT SHORT TERM GOAL #1   Title Pt to  be able to identify signs and symptoms of cellulitis in order to reduce risk of global infection   Time 2   Period Weeks   Status Achieved   PT SHORT TERM GOAL #2   Title Pt to be independent in HEP in order to promote lymph circlation for decreased edema   Time 2   Period Weeks   Status Achieved   PT SHORT TERM GOAL #3   Title Pt to have five or fewer wounds to decrease risk of infection    Time 2   Period Weeks   Status Achieved   PT SHORT TERM GOAL #4   Title Pt to have a 15% reduction of fluid to improve ability to don socks and shoes.    Status Achieved           PT Long Term Goals - 04/28/15 1032    PT LONG TERM GOAL #1   Title Pt to have no wounds in order to reduce risk of infection    Time 4   Period Weeks   Status Achieved   PT LONG TERM GOAL #2   Title Pt to be independent with donning  of compression garment  in order to keep edema under control    Time 4   Period Weeks   Status Achieved   PT LONG TERM GOAL #3   Title Pt to have a reduction of 30% fluid in LE in order to decrease risk of cellulitis   Time 4   Period Weeks   Status Achieved   PT LONG TERM GOAL #4   Title Pt life impact score to be below 40 for improved quality of life    Status Unable to assess  did not test               Plan - 04/28/15 1025    Clinical Impression Statement All wounds are now healed with additional loss of 879.1cc since last measurement on 04/15/15.  Lt LE is now 361.2 cc smaller than Rt LE.  PT educated on stocking wear times and importance of moisturizing at night.  Pt instructed to notifty MD immediately of noted skin breakdown or increased edema.  Worked with patient on donning compression sock that he is now wearing and is 20-8mHg.  Pt has met all goals and is ready for maintenance phase of care.    PT Next Visit Plan discharge to HEP as all goals met.        Problem List Patient Active Problem List   Diagnosis Date Noted  . Screening for colon cancer 02/13/2011  . Lower abdominal pain 01/31/2011  . GERD (gastroesophageal reflux disease) 12/13/2010  . Warfarin anticoagulation 12/13/2010  . Encounter for screening colonoscopy 12/13/2010    ATeena Irani PTA/CLT [phone] 04/28/2015, 10:32 AM  [id number] [address]Phone: [phone]  Fax:  [phone] Name: Jose FIDALGOMRN: [id number] of Birth: 101/30/1952  PHYSICAL THERAPY DISCHARGE SUMMARY  Visits from Start of Care: 6  Current functional level related to goals / functional outcomes: All goals met    Remaining deficits: none   Education / Equipment: Use of compression garments  Plan: Patient agrees to discharge.  Patient goals were met. Patient is being discharged due to meeting the stated rehab goals.   ?????       CRayetta Humphrey PVineyardCLT [phone]

## 2015-04-30 ENCOUNTER — Ambulatory Visit (HOSPITAL_COMMUNITY): Payer: Medicare Other | Admitting: Physical Therapy

## 2015-05-03 ENCOUNTER — Ambulatory Visit (HOSPITAL_COMMUNITY): Payer: Medicare Other | Admitting: Physical Therapy

## 2015-05-05 ENCOUNTER — Encounter (HOSPITAL_COMMUNITY): Payer: Medicare Other | Admitting: Physical Therapy

## 2015-05-07 ENCOUNTER — Encounter (HOSPITAL_COMMUNITY): Payer: Medicare Other | Admitting: Physical Therapy

## 2015-05-10 ENCOUNTER — Encounter (HOSPITAL_COMMUNITY): Payer: Medicare Other | Admitting: Physical Therapy

## 2015-05-12 ENCOUNTER — Encounter (HOSPITAL_COMMUNITY): Payer: Medicare Other | Admitting: Physical Therapy

## 2015-05-14 ENCOUNTER — Encounter (HOSPITAL_COMMUNITY): Payer: Medicare Other | Admitting: Physical Therapy

## 2015-05-17 ENCOUNTER — Encounter (HOSPITAL_COMMUNITY): Payer: Medicare Other | Admitting: Physical Therapy

## 2015-05-19 ENCOUNTER — Encounter (HOSPITAL_COMMUNITY): Payer: Medicare Other | Admitting: Physical Therapy

## 2015-05-21 ENCOUNTER — Encounter (HOSPITAL_COMMUNITY): Payer: Medicare Other | Admitting: Physical Therapy

## 2017-11-21 ENCOUNTER — Encounter: Payer: Self-pay | Admitting: Gastroenterology

## 2018-02-28 ENCOUNTER — Ambulatory Visit: Payer: Medicare Other | Admitting: Nurse Practitioner

## 2018-05-01 ENCOUNTER — Ambulatory Visit: Payer: Medicare Other | Admitting: Nurse Practitioner

## 2018-05-01 ENCOUNTER — Encounter: Payer: Self-pay | Admitting: Nurse Practitioner

## 2018-05-01 DIAGNOSIS — Z800 Family history of malignant neoplasm of digestive organs: Secondary | ICD-10-CM | POA: Diagnosis not present

## 2018-05-01 DIAGNOSIS — R195 Other fecal abnormalities: Secondary | ICD-10-CM

## 2018-05-01 NOTE — Patient Instructions (Signed)
Your health issues we discussed today were:   Need for colonoscopy: 1. We will schedule your colonoscopy for you. 2. We will call Dr. Durene Cal and get the okay to hold your Pradaxa for 2 days prior to your procedure 3. Further recommendations will follow your colonoscopy 4. When we get permission from Dr. Durene Cal to hold your Pradaxa, we will call to finalize scheduling  Overall I recommend:  1. Follow-up based on recommendations made after your colonoscopy, or as needed for GI problems 2. Call us if you have any questions or concerns.  At [facility] we value your feedback. You may receive a survey about your visit today. Please share your experience as we strive to create trusting relationships with our patients to provide genuine, compassionate, quality care.  We appreciate your understanding and patience as we review any laboratory studies, imaging, and other diagnostic tests that are ordered as we care for you. Our office policy is 5 business days for review of these results, and any emergent or urgent results are addressed in a timely manner for your best interest. If you do not hear from our office in 1 week, please contact us.   We also encourage the use of MyChart, which contains your medical information for your review as well. If you are not enrolled in this feature, an access code is on this after visit summary for your convenience. Thank you for allowing [place] to be involved in your care.  It was great to see you today!  I hope you have a great day!!

## 2018-05-01 NOTE — Progress Notes (Signed)
Primary Care Physician:  Alvina FilbertHunter, Denise, MD Primary Gastroenterologist:  Dr. Darrick PennaFields  Chief Complaint  Patient presents with  . +FIT test    HPI:   Jose Farmer is a 68 y.o. male who presents on referral from primary care for heme positive stool and to schedule colonoscopy.  Reviewed information provided with referral including recent office visit which shows no overt GI symptoms. FIT testing was found to be positive and he decided to refer him to GI for further evaluation including possible colonoscopy.  Previous colonoscopy completed 03/10/2011 which found internal hemorrhoids.  Recommended repeat colonoscopy in 10 years, restart Coumadin, high-fiber diet.  Today he states he has known hemorrhoids. Denies overt hematochezia, melena, fever, chills, abdominal pain, N/V, unintentional weight loss. Denies chest pain, dyspnea, dizziness, lightheadedness, syncope, near syncope. Denies any other upper or lower GI symptoms.  He is on Pradaxa for DVT in 1991. Denies PE, CVA.  Past Medical History:  Diagnosis Date  . 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   Marland Kitchen. GERD (gastroesophageal reflux disease)   . Gout   . S/P colonoscopy 1998   Dr. Elder CyphersShiflett, normal  . S/P endoscopy Sept 2012   mild gastritis, negative biopsies, short esophagus, not candidate for reflux surgery or Bravo capsule placement    Past Surgical History:  Procedure Laterality Date  . APPENDECTOMY    . COLONOSCOPY  03/10/2011   Procedure: COLONOSCOPY;  Surgeon: Arlyce HarmanSandi M Fields, MD;  [facility];  Service: Endoscopy;  Laterality: N/A;  10:00  . ESOPHAGECTOMY     ? per pt report  . ESOPHAGOGASTRODUODENOSCOPY  12/16/2010   Procedure: ESOPHAGOGASTRODUODENOSCOPY (EGD);  Surgeon: Arlyce HarmanSandi M Fields, MD;  [facility];  Service: Endoscopy;  Laterality: N/A;  2:00  . HAND SURGERY     tendons  . HERNIA REPAIR     ventral    Current  Outpatient Medications  Medication Sig Dispense Refill  . allopurinol (ZYLOPRIM) 300 MG tablet Take 150 mg by mouth daily.     . cetirizine (ZYRTEC) 10 MG tablet Take 10 mg by mouth daily.    . Cholecalciferol (VITAMIN D) 50 MCG (2000 UT) tablet Take 2,000 Units by mouth daily.    Marland Kitchen. DEXILANT 60 MG capsule Take 1 capsule by mouth daily.    . famotidine (PEPCID) 20 MG tablet Take 1 tablet by mouth as needed.    . furosemide (LASIX) 40 MG tablet Take 40 mg by mouth daily.     Marland Kitchen. PRADAXA 150 MG CAPS capsule Take 1 capsule by mouth 2 (two) times daily.     No current facility-administered medications for this visit.     Allergies as of 05/01/2018 - Review Complete 05/01/2018  Allergen Reaction Noted  . Indomethacin Other (See Comments) 12/14/2010  . Latex  03/02/2011    Family History  Problem Relation Age of Onset  . Colon cancer Sister [id number]  . Anesthesia problems Neg Hx   . Hypotension Neg Hx   . Malignant hyperthermia Neg Hx   . Pseudochol deficiency Neg Hx     Social History   Socioeconomic History  . Marital status: Married    Spouse name: Not on file  . Number of children: Not on file  . Years of education: Not on file  . Highest education level: Not on file  Occupational History  . Occupation: disability    Employer: UNEMPLOYED  Social Needs  .  Financial resource strain: Not on file  . Food insecurity:    Worry: Not on file    Inability: Not on file  . Transportation needs:    Medical: Not on file    Non-medical: Not on file  Tobacco Use  . Smoking status: Never Smoker  . Smokeless tobacco: Never Used  Substance and Sexual Activity  . Alcohol use: No  . Drug use: No  . Sexual activity: Yes    Birth control/protection: None  Lifestyle  . Physical activity:    Days per week: Not on file    Minutes per session: Not on file  . Stress: Not on file  Relationships  . Social connections:    Talks on phone: Not on file    Gets together: Not on file    Attends  religious service: Not on file    Active member of club or organization: Not on file    Attends meetings of clubs or organizations: Not on file    Relationship status: Not on file  . Intimate partner violence:    Fear of current or ex partner: Not on file    Emotionally abused: Not on file    Physically abused: Not on file    Forced sexual activity: Not on file  Other Topics Concern  . Not on file  Social History Narrative  . Not on file    Review of Systems: Complete ROS negative except as per HPI.    Physical Exam: BP 137/77   Pulse (!) 58   Temp 98.1 F (36.7 C) (Oral)   Ht 5\' 7"  (1.702 m)   Wt 229 lb 9.6 oz (104.1 kg)   BMI 35.96 kg/m  General:   Alert and oriented. Pleasant and cooperative. Well-nourished and well-developed.  Eyes:  Without icterus, sclera clear and conjunctiva pink.  Ears:  Normal auditory acuity. Cardiovascular:  S1, S2 present without murmurs appreciated. Extremities without clubbing or edema. Respiratory:  Clear to auscultation bilaterally. No wheezes, rales, or rhonchi. No distress.  Gastrointestinal:  +BS, soft, non-tender and non-distended. No HSM noted. No guarding or rebound. No masses appreciated.  Rectal:  Deferred  Musculoskalatal:  Symmetrical without gross deformities. Skin:  Intact without significant lesions or rashes. Neurologic:  Alert and oriented x4;  grossly normal neurologically. Psych:  Alert and cooperative. Normal mood and affect. Heme/Lymph/Immune: No excessive bruising noted.    05/01/2018 3:10 PM   Disclaimer: This note was dictated with voice recognition software. Similar sounding words can inadvertently be transcribed and may not be corrected upon review.

## 2018-05-01 NOTE — Assessment & Plan Note (Signed)
The patient had a positive fit test at primary care.  They referred him for colonoscopy.  His last colonoscopy was in 2012 and recommended 10-year repeat exam.  Since then he had a sister developed colon cancer and he would likely need follow-up every 5 years.  At this time given his positive fit test we will go ahead and schedule his colonoscopy.  Proceed with colonoscopy with Dr. Darrick Penna in the near future. The risks, benefits, and alternatives have been discussed in detail with the patient. They state understanding and desire to proceed.   The patient is currently on Pradaxa managed by Dr. Durene Cal at [facility].  We will discuss holding this for 2 days prior to procedure (no labs provided documenting creatinine clearance, but no history of chronic kidney disease).  No other anticoagulants, anxiolytics, chronic pain medications, or antidepressants.  Conscious sedation should be adequate for his procedure.

## 2018-05-01 NOTE — Assessment & Plan Note (Signed)
Since his last colonoscopy the patient had a sister developed colon cancer in her late 3550s.  His last colonoscopy was in 2012.  We will repeat this due to positive fit test, as per above.  Primary relatives with colorectal cancer likely changes his screening interval.  Follow-up based on post procedure recommendations.

## 2018-05-01 NOTE — Progress Notes (Signed)
cc'ed to pcp °

## 2018-05-07 ENCOUNTER — Telehealth: Payer: Self-pay

## 2018-05-07 NOTE — Telephone Encounter (Signed)
Faxed letter to Dr. Erasmo Leventhal office at [facility] [place]) to see if it's ok to hold Pradaxa prior to his TCS. Waiting on an approval. [phone]

## 2018-05-15 ENCOUNTER — Telehealth: Payer: Self-pay | Admitting: Gastroenterology

## 2018-05-15 NOTE — Telephone Encounter (Signed)
Pt was following up about scheduling his colonoscopy, I told him that AM had faxed a letter to his PCP on 05/07/2018 regarding holding one of his medications. Please advise if we have heard from them and call pt back at [phone]

## 2018-05-15 NOTE — Telephone Encounter (Signed)
Spoke to nurse at [facility] center [place]), they are going to fax an approval letter. It is ok to hold Pradaxa prior to TCS.

## 2018-05-15 NOTE — Telephone Encounter (Signed)
fowarding to EG to advise how many days prior pt needs to hold

## 2018-05-15 NOTE — Telephone Encounter (Signed)
PT just wants to know if we have heard from PCP yet about holding his blood thinner. I told him I will have Helmut Muster check on that since she had faxed them. He is aware our staff will call to schedule when that is approved and we have Dr. Evelina Dun schedule for May.

## 2018-05-17 NOTE — Telephone Encounter (Signed)
48 hours. Let me know if any other questions.

## 2018-05-21 ENCOUNTER — Other Ambulatory Visit: Payer: Self-pay | Admitting: *Deleted

## 2018-05-21 DIAGNOSIS — R195 Other fecal abnormalities: Secondary | ICD-10-CM

## 2018-05-21 DIAGNOSIS — Z800 Family history of malignant neoplasm of digestive organs: Secondary | ICD-10-CM

## 2018-05-21 MED ORDER — PEG 3350-KCL-NA BICARB-NACL 420 G PO SOLR: 4000.0000 mL | Freq: Once | ORAL | 0 refills | Status: AC

## 2018-05-21 NOTE — Telephone Encounter (Signed)
Spoke with pt and is aware. He had a date held for 4/24 at 2:15pm. Instructions mailed (confirmed mailing address). Rx sent into pharmacy.

## 2018-05-21 NOTE — Addendum Note (Signed)
Addended by: Tommie Sams on: 05/21/2018 08:38 AM   Modules accepted: Orders

## 2018-06-20 ENCOUNTER — Telehealth: Payer: Self-pay | Admitting: Gastroenterology

## 2018-06-20 NOTE — Telephone Encounter (Signed)
Pt wants to cancel his procedure with [facility] on 4/24 due to covid 19 and will reschedule later.

## 2018-06-20 NOTE — Telephone Encounter (Signed)
Called and confirmed patient wants to cancel procedure for now and will call back to r/s once the current situation is over. Called endo and made aware. FYI to EG

## 2018-06-21 NOTE — Telephone Encounter (Signed)
Noted  

## 2018-07-12 ENCOUNTER — Encounter (HOSPITAL_COMMUNITY): Payer: Self-pay

## 2018-07-12 ENCOUNTER — Ambulatory Visit (HOSPITAL_COMMUNITY): Admit: 2018-07-12 | Payer: Medicare Other | Admitting: Gastroenterology

## 2018-07-12 SURGERY — COLONOSCOPY
Anesthesia: Moderate Sedation

## 2020-05-11 ENCOUNTER — Encounter: Payer: Self-pay | Admitting: Internal Medicine

## 2020-06-28 ENCOUNTER — Emergency Department (HOSPITAL_COMMUNITY): Payer: Medicare Other

## 2020-06-28 ENCOUNTER — Encounter (HOSPITAL_COMMUNITY): Payer: Self-pay | Admitting: Emergency Medicine

## 2020-06-28 ENCOUNTER — Emergency Department (HOSPITAL_COMMUNITY)
Admission: EM | Admit: 2020-06-28 | Discharge: 2020-06-28 | Disposition: A | Discharge status: Home / Self Care | Payer: Medicare Other | Attending: Emergency Medicine | Admitting: Emergency Medicine

## 2020-06-28 ENCOUNTER — Other Ambulatory Visit: Payer: Self-pay

## 2020-06-28 DIAGNOSIS — R609 Edema, unspecified: Secondary | ICD-10-CM | POA: Diagnosis not present

## 2020-06-28 DIAGNOSIS — M79605 Pain in left leg: Secondary | ICD-10-CM

## 2020-06-28 DIAGNOSIS — Z7901 Long term (current) use of anticoagulants: Secondary | ICD-10-CM | POA: Insufficient documentation

## 2020-06-28 DIAGNOSIS — Z86718 Personal history of other venous thrombosis and embolism: Secondary | ICD-10-CM | POA: Insufficient documentation

## 2020-06-28 MED ORDER — TRAMADOL HCL 50 MG PO TABS: 50.0000 mg | ORAL_TABLET | Freq: Four times a day (QID) | ORAL | 0 refills | Status: DC | PRN

## 2020-06-28 NOTE — ED Provider Notes (Signed)
[facility] EMERGENCY DEPARTMENT Provider Note   CSN: [id number] Arrival date & time: 06/28/20  1212     History Chief Complaint  Patient presents with  . Leg Pain    Jose Farmer is a 70 y.o. male.  HPI      Jose Farmer is a 70 y.o. male with past medical history of DVT, currently anticoagulated who presents to the Emergency Department complaining of increasing pain and swelling of his left leg.  He developed pain to his left knee 1 week ago.  States the pain now radiates into his left lower leg and behind his left knee.  Pain is worse when lying supine and when trying to sleep.  Pain occasionally improves with standing or walking.  Denies any missed doses of his anticoagulant.  Also denies fever, chills, swelling or redness of his lower leg.  No known injury.  No chest pain or shortness of breath.   Past Medical History:  Diagnosis Date  . 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   Marland Kitchen GERD (gastroesophageal reflux disease)   . Gout   . S/P colonoscopy 1998   Dr. Elder Cyphers, normal  . S/P endoscopy Sept 2012   mild gastritis, negative biopsies, short esophagus, not candidate for reflux surgery or Bravo capsule placement    Patient Active Problem List   Diagnosis Date Noted  . Positive FIT (fecal immunochemical test) 05/01/2018  . Family history of colon cancer 05/01/2018  . Screening for colon cancer 02/13/2011  . Lower abdominal pain 01/31/2011  . GERD (gastroesophageal reflux disease) 12/13/2010  . Warfarin anticoagulation 12/13/2010  . Encounter for screening colonoscopy 12/13/2010    Past Surgical History:  Procedure Laterality Date  . APPENDECTOMY    . COLONOSCOPY  03/10/2011   Procedure: COLONOSCOPY;  Surgeon: Arlyce Harman, MD;  Location: [facility] ENDO [facility];  Service: Endoscopy;  Laterality: N/A;  10:00  . ESOPHAGECTOMY     ? per pt report  . ESOPHAGOGASTRODUODENOSCOPY  12/16/2010    Procedure: ESOPHAGOGASTRODUODENOSCOPY (EGD);  Surgeon: Arlyce Harman, MD;  Location: [facility] END[facility];  Service: Endoscopy;  Laterality: N/A;  2:00  . HAND SURGERY     tendons  . HERNIA REPAIR     ventral       Family History  Problem Relation Age of Onset  . Colon cancer Sister 27  . Anesthesia problems Neg Hx   . Hypotension Neg Hx   . Malignant hyperthermia Neg Hx   . Pseudochol deficiency Neg Hx     Social History   Tobacco Use  . Smoking status: Never Smoker  . Smokeless tobacco: Never Used  Substance Use Topics  . Alcohol use: No  . Drug use: No    Home Medications Prior to Admission medications   Medication Sig Start Date End Date Taking? Authorizing Provider  allopurinol (ZYLOPRIM) 300 MG tablet Take 150 mg by mouth daily.   Yes [provider]  cetirizine (ZYRTEC) 10 MG tablet Take 10 mg by mouth daily. 11/16/17  Yes [provider]  Cholecalciferol (VITAMIN D) 50 MCG (2000 UT) tablet Take 2,000 Units by mouth daily.   Yes [provider]  DEXILANT 60 MG capsule Take 1 capsule by mouth daily. 11/16/17  Yes [provider]  famotidine (PEPCID) 20 MG tablet Take 1 tablet by mouth as needed. 02/26/18  Yes [provider]  furosemide (LASIX) 40 MG tablet Take 40 mg  by mouth daily.   Yes [provider]  PRADAXA 150 MG CAPS capsule Take 1 capsule by mouth 2 (two) times daily. 02/19/18  Yes [provider]    Allergies    Indomethacin and Latex  Review of Systems   Review of Systems  Constitutional: Negative for chills and fever.  Respiratory: Negative for chest tightness and shortness of breath.   Cardiovascular: Negative for chest pain.  Gastrointestinal: Negative for abdominal pain, nausea and vomiting.  Genitourinary: Negative for difficulty urinating and dysuria.  Musculoskeletal: Positive for arthralgias (Left knee pain) and myalgias (Left lower leg pain and swelling.). Negative for joint swelling.   Skin: Negative for color change and wound.  Neurological: Negative for dizziness, weakness and numbness.  Psychiatric/Behavioral: Negative for confusion.    Physical Exam Updated Vital Signs BP (!) 176/94   Pulse (!) 58   Temp 98.4 F (36.9 C) (Oral)   Resp 16   Ht 5\' 7"  (1.702 m)   Wt 107.5 kg   SpO2 98%   BMI 37.12 kg/m   Physical Exam Vitals and nursing note reviewed.  Constitutional:      General: He is not in acute distress.    Appearance: Normal appearance. He is not toxic-appearing.  HENT:     Head: Atraumatic.  Eyes:     Conjunctiva/sclera: Conjunctivae normal.  Cardiovascular:     Rate and Rhythm: Normal rate and regular rhythm.     Pulses: Normal pulses.  Pulmonary:     Effort: Pulmonary effort is normal.     Breath sounds: Normal breath sounds.  Abdominal:     Palpations: Abdomen is soft.     Tenderness: There is no abdominal tenderness.  Musculoskeletal:        General: Swelling and tenderness present. No signs of injury. Normal range of motion.     Cervical back: Normal range of motion.     Right lower leg: Edema present.     Left lower leg: Edema present.     Comments: Patient with edema of bilateral lower extremities, left greater than right.  Tenderness of the left posterior calf.  No palpable cord.  No excessive warmth or erythema.  He also has tenderness along the left popliteal fossa.  Skin:    General: Skin is warm.     Capillary Refill: Capillary refill takes less than 2 seconds.  Neurological:     General: No focal deficit present.     Mental Status: He is alert.     Sensory: No sensory deficit.     Motor: No weakness.     ED Results / Procedures / Treatments   Labs (all labs ordered are listed, but only abnormal results are displayed) Labs Reviewed - No data to display  EKG None  Radiology Venous Img Lower Unilateral Left  Result Date: 06/28/2020 CLINICAL DATA:  Left leg pain and swelling EXAM: LEFT LOWER EXTREMITY VENOUS  DOPPLER ULTRASOUND TECHNIQUE: Gray-scale sonography with compression, as well as color and duplex ultrasound, were performed to evaluate the deep venous system(s) from the level of the common femoral vein through the popliteal and proximal calf veins. COMPARISON:  None. FINDINGS: VENOUS Normal compressibility of the common femoral, superficial femoral, and popliteal veins, as well as the visualized calf veins. Visualized portions of profunda femoral vein and great saphenous vein unremarkable. No filling defects to suggest DVT on grayscale or color Doppler imaging. Doppler waveforms show normal direction of venous flow, normal respiratory plasticity and response to augmentation.  Limited views of the contralateral common femoral vein are unremarkable. OTHER None. Limitations: none IMPRESSION: Negative. Electronically Signed   By: Sharlet Salina M.D.   On: 06/28/2020 16:37   DG Knee Complete 4 Views Left  Result Date: 06/28/2020 CLINICAL DATA:  Left leg pain and swelling EXAM: LEFT KNEE - COMPLETE 4+ VIEW COMPARISON:  None. FINDINGS: Frontal, bilateral oblique, lateral views of the left knee are obtained. No fracture, subluxation, or dislocation. Minimal osteophyte formation within all 3 compartments of the left knee. Joint spaces are relatively well preserved. No joint effusion. Soft tissues are normal. IMPRESSION: 1. Mild 3 compartmental osteoarthritis.  No acute bony abnormality. Electronically Signed   By: Sharlet Salina M.D.   On: 06/28/2020 15:58    Procedures Procedures   Medications Ordered in ED Medications - No data to display  ED Course  I have reviewed the triage vital signs and the nursing notes.  Pertinent labs & imaging results that were available during my care of the patient were reviewed by me and considered in my medical decision making (see chart for details).    MDM Rules/Calculators/A&P                          Patient here with history of DVT, chronically anticoagulated on  Pradaxa.  Here for increased pain and swelling of his left leg.  No known injury.  Neurovascularly intact.  Venous ultrasound of the left lower extremity negative for DVT.  X-rays of the left knee shows OA.  Patient offered knee brace for support but declines.  States that he will follow-up with local orthopedics, prefers to see Dr. Hilda Lias.  Appears appropriate for discharge home, return precautions were discussed.  Final Clinical Impression(s) / ED Diagnoses Final diagnoses:  Left leg pain    Rx / DC Orders ED Discharge Orders    None       Pauline Aus, PA-C 06/28/20 1741    Derwood Kaplan, MD 07/02/20 1757

## 2020-06-28 NOTE — ED Triage Notes (Signed)
Pt c/o left leg pain and swelling that started last week.

## 2020-06-28 NOTE — Discharge Instructions (Addendum)
Ultrasound of your left leg today is negative for evidence of a blood clot.  You do have some arthritis of your left knee.  Try applying ice packs on and off for swelling.  Call Dr. Sanjuan Dame office to arrange a follow-up appointment.

## 2020-07-06 ENCOUNTER — Other Ambulatory Visit: Payer: Self-pay

## 2020-07-06 ENCOUNTER — Ambulatory Visit (INDEPENDENT_AMBULATORY_CARE_PROVIDER_SITE_OTHER): Payer: Medicare Other | Admitting: Orthopaedic Surgery

## 2020-07-06 ENCOUNTER — Encounter: Payer: Self-pay | Admitting: Orthopaedic Surgery

## 2020-07-06 VITALS — BP 167/87 | HR 68 | Ht 67.0 in | Wt 238.2 lb

## 2020-07-06 DIAGNOSIS — G8929 Other chronic pain: Secondary | ICD-10-CM | POA: Diagnosis not present

## 2020-07-06 DIAGNOSIS — Z7901 Long term (current) use of anticoagulants: Secondary | ICD-10-CM | POA: Diagnosis not present

## 2020-07-06 DIAGNOSIS — M25562 Pain in left knee: Secondary | ICD-10-CM

## 2020-07-06 NOTE — Progress Notes (Signed)
Subjective:    Patient ID: Jose Farmer, male    DOB: 1950-10-08, 70 y.o.   MRN: [id number]  HPI He has long history of left knee pain.  He has had clots in the past and is anticoagulated now.  He had increasing pain of the left knee over the last 10 days.  He went to the ER on 06-28-20 and was seen.  He was checked for clot and Doppler was negative.  He has pain in the knee.  He has popping and swelling but no giving way.  He is limping.  He has tried ice, heat and Tylenol. X-rays showed three compartment DJD significantly.  I have reviewed the ER records.  I have independently reviewed and interpreted x-rays of this patient done at another site by another physician or qualified health professional.     Review of Systems  Constitutional: Positive for activity change.  Cardiovascular: Positive for leg swelling.  Musculoskeletal: Positive for arthralgias, gait problem, joint swelling and myalgias.  All other systems reviewed and are negative.  For Review of Systems, all other systems reviewed and are negative.  The following is a summary of the past history medically, past history surgically, known current medicines, social history and family history.  This information is gathered electronically by the computer from prior information and documentation.  I review this each visit and have found including this information at this point in the chart is beneficial and informative.   Past Medical History:  Diagnosis Date  . 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   Marland Kitchen GERD (gastroesophageal reflux disease)   . Gout   . S/P colonoscopy 1998   Dr. Elder Cyphers, normal  . S/P endoscopy Sept 2012   mild gastritis, negative biopsies, short esophagus, not candidate for reflux surgery or Bravo capsule placement    Past Surgical History:  Procedure Laterality Date  . APPENDECTOMY    . COLONOSCOPY  03/10/2011   Procedure:  COLONOSCOPY;  Surgeon: Arlyce Harman, MD;  Location: [facility];  Service: Endoscopy;  Laterality: N/A;  10:00  . ESOPHAGECTOMY     ? per pt report  . ESOPHAGOGASTRODUODENOSCOPY  12/16/2010   Procedure: ESOPHAGOGASTRODUODENOSCOPY (EGD);  Surgeon: Arlyce Harman, MD;  Location: [facility];  Service: Endoscopy;  Laterality: N/A;  2:00  . HAND SURGERY     tendons  . HERNIA REPAIR     ventral    Current Outpatient Medications on File Prior to Visit  Medication Sig Dispense Refill  . allopurinol (ZYLOPRIM) 300 MG tablet Take 150 mg by mouth daily.    . cetirizine (ZYRTEC) 10 MG tablet Take 10 mg by mouth daily.    . Cholecalciferol (VITAMIN D) 50 MCG (2000 UT) tablet Take 2,000 Units by mouth daily.    Marland Kitchen DEXILANT 60 MG capsule Take 1 capsule by mouth daily.    . famotidine (PEPCID) 20 MG tablet Take 1 tablet by mouth as needed.    . furosemide (LASIX) 40 MG tablet Take 40 mg by mouth daily.    Marland Kitchen PRADAXA 150 MG CAPS capsule Take 1 capsule by mouth 2 (two) times daily.    . traMADol (ULTRAM) 50 MG tablet Take 1 tablet (50 mg total) by mouth every 6 (six) hours as needed. 12 tablet 0   No current facility-administered medications on file prior to visit.    Social History   Socioeconomic History  . Marital  status: Married    Spouse name: Not on file  . Number of children: Not on file  . Years of education: Not on file  . Highest education level: Not on file  Occupational History  . Occupation: disability    Employer: UNEMPLOYED  Tobacco Use  . Smoking status: Never Smoker  . Smokeless tobacco: Never Used  Substance and Sexual Activity  . Alcohol use: No  . Drug use: No  . Sexual activity: Yes    Birth control/protection: None  Other Topics Concern  . Not on file  Social History Narrative  . Not on file   Social Determinants of Health   Financial Resource Strain: Not on file  Food Insecurity: Not on file  Transportation Needs: Not on file  Physical Activity: Not on  file  Stress: Not on file  Social Connections: Not on file  Intimate Partner Violence: Not on file    Family History  Problem Relation Age of Onset  . Colon cancer Sister 74  . Anesthesia problems Neg Hx   . Hypotension Neg Hx   . Malignant hyperthermia Neg Hx   . Pseudochol deficiency Neg Hx     BP (!) 167/87   Pulse 68   Ht 5\' 7"  (1.702 m)   Wt 238 lb 4 oz (108.1 kg)   BMI 37.32 kg/m   Body mass index is 37.32 kg/m.     Objective:   Physical Exam Vitals and nursing note reviewed. Exam conducted with a chaperone present.  Constitutional:      Appearance: He is well-developed.  HENT:     Head: Normocephalic and atraumatic.  Eyes:     Conjunctiva/sclera: Conjunctivae normal.     Pupils: Pupils are equal, round, and reactive to light.  Cardiovascular:     Rate and Rhythm: Normal rate and regular rhythm.  Pulmonary:     Effort: Pulmonary effort is normal.  Abdominal:     Palpations: Abdomen is soft.  Musculoskeletal:     Cervical back: Normal range of motion and neck supple.       Legs:  Skin:    General: Skin is warm and dry.  Neurological:     Mental Status: He is alert and oriented to person, place, and time.     Cranial Nerves: No cranial nerve deficit.     Motor: No abnormal muscle tone.     Coordination: Coordination normal.     Deep Tendon Reflexes: Reflexes are normal and symmetric. Reflexes normal.  Psychiatric:        Behavior: Behavior normal.        Thought Content: Thought content normal.        Judgment: Judgment normal.           Assessment & Plan:   Encounter Diagnoses  Name Primary?  . Chronic pain of left knee Yes  . Anticoagulated    I have explained the findings of marked DJD of the knee.  He cannot take NSAIDs as he is anti-coagulated.  I will try injection.  He may not be candidate for total joint secondary to other medical issues.  PROCEDURE NOTE:  The patient requests injections of the left knee , verbal consent was  obtained.  The left knee was prepped appropriately after time out was performed.   Sterile technique was observed and injection of 1 cc of Celestone 6 mg with several cc's of plain xylocaine. Anesthesia was provided by ethyl chloride and a 20-gauge needle was used to inject  the knee area. The injection was tolerated well.  A band aid dressing was applied.  The patient was advised to apply ice later today and tomorrow to the injection sight as needed.  Return in two weeks.  Call if any problem.  Precautions discussed.   Electronically Signed Darreld Mclean, MD 4/19/20228:46 AM

## 2020-07-20 ENCOUNTER — Ambulatory Visit: Payer: Medicare Other | Admitting: Orthopaedic Surgery

## 2020-07-20 ENCOUNTER — Encounter: Payer: Self-pay | Admitting: Orthopaedic Surgery

## 2020-07-20 ENCOUNTER — Other Ambulatory Visit: Payer: Self-pay

## 2020-07-20 VITALS — BP 148/78 | HR 78 | Ht 67.0 in | Wt 230.0 lb

## 2020-07-20 DIAGNOSIS — M25562 Pain in left knee: Secondary | ICD-10-CM | POA: Diagnosis not present

## 2020-07-20 DIAGNOSIS — Z86718 Personal history of other venous thrombosis and embolism: Secondary | ICD-10-CM | POA: Insufficient documentation

## 2020-07-20 DIAGNOSIS — J302 Other seasonal allergic rhinitis: Secondary | ICD-10-CM | POA: Insufficient documentation

## 2020-07-20 DIAGNOSIS — G8929 Other chronic pain: Secondary | ICD-10-CM

## 2020-07-20 DIAGNOSIS — I890 Lymphedema, not elsewhere classified: Secondary | ICD-10-CM | POA: Insufficient documentation

## 2020-07-20 DIAGNOSIS — I872 Venous insufficiency (chronic) (peripheral): Secondary | ICD-10-CM | POA: Insufficient documentation

## 2020-07-20 DIAGNOSIS — E785 Hyperlipidemia, unspecified: Secondary | ICD-10-CM | POA: Insufficient documentation

## 2020-07-20 DIAGNOSIS — M109 Gout, unspecified: Secondary | ICD-10-CM | POA: Insufficient documentation

## 2020-07-20 DIAGNOSIS — E559 Vitamin D deficiency, unspecified: Secondary | ICD-10-CM | POA: Insufficient documentation

## 2020-07-20 DIAGNOSIS — I83029 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: Secondary | ICD-10-CM | POA: Insufficient documentation

## 2020-07-20 DIAGNOSIS — I7300 Raynaud's syndrome without gangrene: Secondary | ICD-10-CM | POA: Insufficient documentation

## 2020-07-20 DIAGNOSIS — Z7901 Long term (current) use of anticoagulants: Secondary | ICD-10-CM | POA: Insufficient documentation

## 2020-07-20 DIAGNOSIS — B070 Plantar wart: Secondary | ICD-10-CM | POA: Insufficient documentation

## 2020-07-20 NOTE — Patient Instructions (Signed)
While we are working on your approval please go ahead and call to schedule your appointment with [facility] in at least one (1) week.  ° °Central Scheduling °[phone] °

## 2020-07-20 NOTE — Progress Notes (Signed)
Patient YQ:MVHQION LOIS Farmer, male DOB:[birth year], 70 y.o. GEX:[id number]  Chief Complaint  Patient presents with  . Knee Pain    Left/ a little better     HPI  Jose Farmer is a 70 y.o. male who has continued pain in the left knee.  The injection helped for a few days and his pain has returned.  He has swelling but less.  He has feeling the knee may give way.  He has popping.  He does not have redness.  I am concerned about meniscus tear.  I will get MRI.   Body mass index is 36.02 kg/m.  ROS  Review of Systems  Constitutional: Positive for activity change.  Cardiovascular: Positive for leg swelling.  Musculoskeletal: Positive for arthralgias, gait problem, joint swelling and myalgias.  Allergic/Immunologic: Food allergies: Get MRi of the left knee.  All other systems reviewed and are negative.   All other systems reviewed and are negative.  The following is a summary of the past history medically, past history surgically, known current medicines, social history and family history.  This information is gathered electronically by the computer from prior information and documentation.  I review this each visit and have found including this information at this point in the chart is beneficial and informative.    Past Medical History:  Diagnosis Date  . 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   Marland Kitchen GERD (gastroesophageal reflux disease)   . Gout   . S/P colonoscopy 1998   Dr. Elder Farmer, normal  . S/P endoscopy Sept 2012   mild gastritis, negative biopsies, short esophagus, not candidate for reflux surgery or Bravo capsule placement    Past Surgical History:  Procedure Laterality Date  . APPENDECTOMY    . COLONOSCOPY  03/10/2011   Procedure: COLONOSCOPY;  Surgeon: Jose Harman, MD;  Location: [facility];  Service: Endoscopy;  Laterality: N/A;  10:00  . ESOPHAGECTOMY     ? per pt report  .  ESOPHAGOGASTRODUODENOSCOPY  12/16/2010   Procedure: ESOPHAGOGASTRODUODENOSCOPY (EGD);  Surgeon: Jose Harman, MD;  Location: [facility];  Service: Endoscopy;  Laterality: N/A;  2:00  . HAND SURGERY     tendons  . HERNIA REPAIR     ventral    Family History  Problem Relation Age of Onset  . Colon cancer Sister 26  . Anesthesia problems Neg Hx   . Hypotension Neg Hx   . Malignant hyperthermia Neg Hx   . Pseudochol deficiency Neg Hx     Social History Social History   Tobacco Use  . Smoking status: Never Smoker  . Smokeless tobacco: Never Used  Substance Use Topics  . Alcohol use: No  . Drug use: No    Allergies  Allergen Reactions  . Indomethacin Other (See Comments)    Hallucinations   . Latex     Stockings for circulation break patient out if they contain latex    Current Outpatient Medications  Medication Sig Dispense Refill  . allopurinol (ZYLOPRIM) 300 MG tablet Take 150 mg by mouth daily.    . cetirizine (ZYRTEC) 10 MG tablet Take 10 mg by mouth daily.    . Cholecalciferol (VITAMIN D) 50 MCG (2000 UT) tablet Take 2,000 Units by mouth daily.    Marland Kitchen DEXILANT 60 MG capsule Take 1 capsule by mouth daily.    . famotidine (PEPCID) 20 MG tablet Take 1 tablet by mouth as needed.    Marland Kitchen  furosemide (LASIX) 40 MG tablet Take 40 mg by mouth daily.    Marland Kitchen PRADAXA 150 MG CAPS capsule Take 1 capsule by mouth 2 (two) times daily.    . traMADol (ULTRAM) 50 MG tablet Take 1 tablet (50 mg total) by mouth every 6 (six) hours as needed. 12 tablet 0   No current facility-administered medications for this visit.     Physical Exam  Blood pressure (!) 148/78, pulse 78, height 5\' 7"  (1.702 m), weight 230 lb (104.3 kg).  Constitutional: overall normal hygiene, normal nutrition, well developed, normal grooming, normal body habitus. Assistive device:none  Musculoskeletal: gait and station Limp left, muscle tone and strength are normal, no tremors or atrophy is present.  .   Neurological: coordination overall normal.  Deep tendon reflex/nerve stretch intact.  Sensation normal.  Cranial nerves II-XII intact.   Skin:   Normal overall no scars, lesions, ulcers or rashes. No psoriasis.  Psychiatric: Alert and oriented x 3.  Recent memory intact, remote memory unclear.  Normal mood and affect. Well groomed.  Good eye contact.  Cardiovascular: overall no swelling, no varicosities, no edema bilaterally, normal temperatures of the legs and arms, no clubbing, cyanosis and good capillary refill.  Lymphatic: palpation is normal.  Knee on the left had effusion, crepitus, ROM 0 to 105, limp left, NV intact, positive medial McMurray.  All other systems reviewed and are negative   The patient has been educated about the nature of the problem(s) and counseled on treatment options.  The patient appeared to understand what I have discussed and is in agreement with it.  Encounter Diagnoses  Name Primary?  . Chronic pain of left knee Yes  . Anticoagulated     PLAN Call if any problems.  Precautions discussed.  Continue current medications.   Return to clinic 3 weeks   Get MRI of the left knee.  Electronically Signed , MD 5/3/20228:30 AM

## 2020-08-06 ENCOUNTER — Ambulatory Visit (HOSPITAL_COMMUNITY)
Admission: RE | Admit: 2020-08-06 | Discharge: 2020-08-06 | Disposition: A | Discharge status: Home / Self Care | Payer: Medicare Other | Source: Ambulatory Visit | Attending: Orthopaedic Surgery | Admitting: Orthopaedic Surgery

## 2020-08-06 DIAGNOSIS — M25562 Pain in left knee: Secondary | ICD-10-CM | POA: Diagnosis not present

## 2020-08-06 DIAGNOSIS — G8929 Other chronic pain: Secondary | ICD-10-CM | POA: Diagnosis present

## 2020-08-10 ENCOUNTER — Ambulatory Visit: Payer: Medicare Other | Admitting: Orthopaedic Surgery

## 2020-08-10 ENCOUNTER — Encounter: Payer: Self-pay | Admitting: Orthopaedic Surgery

## 2020-08-10 ENCOUNTER — Other Ambulatory Visit: Payer: Self-pay

## 2020-08-10 VITALS — BP 128/71 | HR 61 | Ht 67.0 in | Wt 236.2 lb

## 2020-08-10 DIAGNOSIS — S83242D Other tear of medial meniscus, current injury, left knee, subsequent encounter: Secondary | ICD-10-CM

## 2020-08-10 DIAGNOSIS — S83282D Other tear of lateral meniscus, current injury, left knee, subsequent encounter: Secondary | ICD-10-CM

## 2020-08-10 DIAGNOSIS — Z7901 Long term (current) use of anticoagulants: Secondary | ICD-10-CM

## 2020-08-10 NOTE — Progress Notes (Signed)
Patient Jose Farmer, male DOB:Sep 28, 1950, 70 y.o. FIE:[id number]  Chief Complaint  Patient presents with  . Results    MRI left knee    HPI  Jose Farmer is a 70 y.o. male who has pain in the left knee.  He has some giving way at times.  He had MRI of the knee showing:  IMPRESSION: 1. Oblique tear of the posterior horn-body junction of the medial meniscus extending into the posterior horn. Degeneration of the posterior horn. 2. Oblique tear of the anterior horn of the lateral meniscus extending to the superior articular surface. Tiny radial tear of the free edge of the body of the lateral meniscus. 3. Tricompartmental cartilage abnormalities as described above. 4. Mild edema in superolateral Hoffa's fat as can be seen with patellar tendon-lateral femoral condyle friction syndrome.  I have explained the finding to him.  I have recommended consideration of elective arthroscopy of the knee.  I have explained the procedure to him.  He is anticoagulated.  He thought about it and said he would rather wait for any surgery now.  The injections helped and he would like to try them.  He is concerned about getting off of anticoagulation.  I told him that it is an elective procedure.  I have given cautions about activity and going down steps.  I have independently reviewed the MRI.      Body mass index is 36.99 kg/m.  ROS  Review of Systems  Constitutional: Positive for activity change.  Cardiovascular: Positive for leg swelling.  Musculoskeletal: Positive for arthralgias, gait problem, joint swelling and myalgias.  Allergic/Immunologic: Food allergies: Get MRi of the left knee.  All other systems reviewed and are negative.   All other systems reviewed and are negative.  The following is a summary of the past history medically, past history surgically, known current medicines, social history and family history.  This information is gathered electronically by the  computer from prior information and documentation.  I review this each visit and have found including this information at this point in the chart is beneficial and informative.    Past Medical History:  Diagnosis Date  . 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   Marland Kitchen GERD (gastroesophageal reflux disease)   . Gout   . S/P colonoscopy 1998   Dr. Elder Cyphers, normal  . S/P endoscopy Sept 2012   mild gastritis, negative biopsies, short esophagus, not candidate for reflux surgery or Bravo capsule placement    Past Surgical History:  Procedure Laterality Date  . APPENDECTOMY    . COLONOSCOPY  03/10/2011   Procedure: COLONOSCOPY;  Surgeon: Arlyce Harman, MD;  [facility];  Service: Endoscopy;  Laterality: N/A;  10:00  . ESOPHAGECTOMY     ? per pt report  . ESOPHAGOGASTRODUODENOSCOPY  12/16/2010   Procedure: ESOPHAGOGASTRODUODENOSCOPY (EGD);  Surgeon: Arlyce Harman, MD;  Location: [facility];  Service: Endoscopy;  Laterality: N/A;  2:00  . HAND SURGERY     tendons  . HERNIA REPAIR     ventral    Family History  Problem Relation Age of Onset  . Colon cancer Sister 66  . Anesthesia problems Neg Hx   . Hypotension Neg Hx   . Malignant hyperthermia Neg Hx   . Pseudochol deficiency Neg Hx     Social History Social History   Tobacco Use  . Smoking status: Never Smoker  . Smokeless  tobacco: Never Used  Substance Use Topics  . Alcohol use: No  . Drug use: No    Allergies  Allergen Reactions  . Indomethacin Other (See Comments)    Hallucinations   . Latex     Stockings for circulation break patient out if they contain latex    Current Outpatient Medications  Medication Sig Dispense Refill  . allopurinol (ZYLOPRIM) 300 MG tablet Take 150 mg by mouth daily.    . cetirizine (ZYRTEC) 10 MG tablet Take 10 mg by mouth daily.    . Cholecalciferol (VITAMIN D) 50 MCG (2000 UT) tablet Take 2,000 Units by  mouth daily.    Marland Kitchen DEXILANT 60 MG capsule Take 1 capsule by mouth daily.    . famotidine (PEPCID) 20 MG tablet Take 1 tablet by mouth as needed.    . furosemide (LASIX) 40 MG tablet Take 40 mg by mouth daily.    Marland Kitchen PRADAXA 150 MG CAPS capsule Take 1 capsule by mouth 2 (two) times daily.    . traMADol (ULTRAM) 50 MG tablet Take 1 tablet (50 mg total) by mouth every 6 (six) hours as needed. 12 tablet 0   No current facility-administered medications for this visit.     Physical Exam  Blood pressure 128/71, pulse 61, height 5\' 7"  (1.702 m), weight 236 lb 3.2 oz (107.1 kg).  Constitutional: overall normal hygiene, normal nutrition, well developed, normal grooming, normal body habitus. Assistive device:none  Musculoskeletal: gait and station Limp left, muscle tone and strength are normal, no tremors or atrophy is present.  .  Neurological: coordination overall normal.  Deep tendon reflex/nerve stretch intact.  Sensation normal.  Cranial nerves II-XII intact.   Skin:   Normal overall no scars, lesions, ulcers or rashes. No psoriasis.  Psychiatric: Alert and oriented x 3.  Recent memory intact, remote memory unclear.  Normal mood and affect. Well groomed.  Good eye contact.  Cardiovascular: overall no swelling, no varicosities, no edema bilaterally, normal temperatures of the legs and arms, no clubbing, cyanosis and good capillary refill.  Lymphatic: palpation is normal.  Left knee with some effusion, crepitus, ROM 0 to 105, positive medical McMurray, limp left, NV intact.  Distal brawny edema.  All other systems reviewed and are negative   The patient has been educated about the nature of the problem(s) and counseled on treatment options.  The patient appeared to understand what I have discussed and is in agreement with it.  Encounter Diagnoses  Name Primary?  . Other tear of medial meniscus, current injury, left knee, subsequent encounter Yes  . Tear of lateral meniscus of left knee,  current, subsequent encounter   . Anticoagulated    PROCEDURE NOTE:  The patient requests injections of the left knee , verbal consent was obtained.  The left knee was prepped appropriately after time out was performed.   Sterile technique was observed and injection of 1 cc of Celestone 6 mg with several cc's of plain xylocaine. Anesthesia was provided by ethyl chloride and a 20-gauge needle was used to inject the knee area. The injection was tolerated well.  A band aid dressing was applied.  The patient was advised to apply ice later today and tomorrow to the injection sight as needed.  PLAN Call if any problems.  Precautions discussed.  Continue current medications.   Return to clinic 6 weeks   If he should reconsider arthroscopy, call.  Electronically Signed , MD 5/24/20228:40 AM

## 2020-09-21 ENCOUNTER — Ambulatory Visit (INDEPENDENT_AMBULATORY_CARE_PROVIDER_SITE_OTHER): Payer: Medicare Other | Admitting: Orthopaedic Surgery

## 2020-09-21 ENCOUNTER — Other Ambulatory Visit: Payer: Self-pay

## 2020-09-21 ENCOUNTER — Encounter: Payer: Self-pay | Admitting: Orthopaedic Surgery

## 2020-09-21 VITALS — BP 150/71 | HR 57 | Ht 67.0 in | Wt 232.6 lb

## 2020-09-21 DIAGNOSIS — S83242D Other tear of medial meniscus, current injury, left knee, subsequent encounter: Secondary | ICD-10-CM | POA: Diagnosis not present

## 2020-09-21 DIAGNOSIS — Z7901 Long term (current) use of anticoagulants: Secondary | ICD-10-CM

## 2020-09-21 DIAGNOSIS — G8929 Other chronic pain: Secondary | ICD-10-CM

## 2020-09-21 DIAGNOSIS — M25552 Pain in left hip: Secondary | ICD-10-CM

## 2020-09-21 NOTE — Addendum Note (Signed)
Addended byCaffie Damme on: 09/21/2020 08:47 AM   Modules accepted: Orders

## 2020-09-21 NOTE — Progress Notes (Signed)
PROCEDURE NOTE:  The patient requests injections of the left knee , verbal consent was obtained.  The left knee was prepped appropriately after time out was performed.   Sterile technique was observed and injection of 1 cc of Celestone 6 mg with several cc's of plain xylocaine. Anesthesia was provided by ethyl chloride and a 20-gauge needle was used to inject the knee area. The injection was tolerated well.  A band aid dressing was applied.  The patient was advised to apply ice later today and tomorrow to the injection sight as needed.   Return in six weeks.  Call if any problem.  Precautions discussed.  Electronically Signed Darreld Mclean, MD 7/5/20228:44 AM

## 2020-11-04 ENCOUNTER — Other Ambulatory Visit: Payer: Self-pay

## 2020-11-04 ENCOUNTER — Encounter: Payer: Self-pay | Admitting: Orthopaedic Surgery

## 2020-11-04 ENCOUNTER — Ambulatory Visit: Payer: Medicare Other | Admitting: Orthopaedic Surgery

## 2020-11-04 DIAGNOSIS — M25562 Pain in left knee: Secondary | ICD-10-CM

## 2020-11-04 DIAGNOSIS — G8929 Other chronic pain: Secondary | ICD-10-CM

## 2020-11-04 DIAGNOSIS — Z7901 Long term (current) use of anticoagulants: Secondary | ICD-10-CM

## 2020-11-04 DIAGNOSIS — S83242D Other tear of medial meniscus, current injury, left knee, subsequent encounter: Secondary | ICD-10-CM

## 2020-11-04 NOTE — Progress Notes (Signed)
PROCEDURE NOTE:  The patient requests injections of the left knee , verbal consent was obtained.  The left knee was prepped appropriately after time out was performed.   Sterile technique was observed and injection of 1 cc of Celestone 6 mg with several cc's of plain xylocaine. Anesthesia was provided by ethyl chloride and a 20-gauge needle was used to inject the knee area. The injection was tolerated well.  A band aid dressing was applied.  The patient was advised to apply ice later today and tomorrow to the injection sight as needed.   Return in six weeks.  Call if any problem.  Precautions discussed.  Electronically Signed Darreld Mclean, MD 8/18/20228:10 AM

## 2020-12-16 ENCOUNTER — Other Ambulatory Visit: Payer: Self-pay

## 2020-12-16 ENCOUNTER — Ambulatory Visit: Payer: Medicare Other | Admitting: Orthopaedic Surgery

## 2020-12-16 ENCOUNTER — Encounter: Payer: Self-pay | Admitting: Orthopaedic Surgery

## 2020-12-16 DIAGNOSIS — G8929 Other chronic pain: Secondary | ICD-10-CM | POA: Diagnosis not present

## 2020-12-16 DIAGNOSIS — M25562 Pain in left knee: Secondary | ICD-10-CM

## 2020-12-16 NOTE — Progress Notes (Signed)
PROCEDURE NOTE:  The patient requests injections of the left knee , verbal consent was obtained.  The left knee was prepped appropriately after time out was performed.   Sterile technique was observed and injection of 1 cc of Celestone 6 mg with several cc's of plain xylocaine. Anesthesia was provided by ethyl chloride and a 20-gauge needle was used to inject the knee area. The injection was tolerated well.  A band aid dressing was applied.  The patient was advised to apply ice later today and tomorrow to the injection sight as needed.   Return in six weeks.  Call if any problem.  Precautions discussed.  Electronically Signed Darreld Mclean, MD 9/29/20228:10 AM

## 2021-01-27 ENCOUNTER — Encounter: Payer: Self-pay | Admitting: Orthopaedic Surgery

## 2021-01-27 ENCOUNTER — Ambulatory Visit: Payer: Medicare Other | Admitting: Orthopaedic Surgery

## 2021-01-27 ENCOUNTER — Other Ambulatory Visit: Payer: Self-pay

## 2021-01-27 DIAGNOSIS — G8929 Other chronic pain: Secondary | ICD-10-CM | POA: Diagnosis not present

## 2021-01-27 DIAGNOSIS — M25562 Pain in left knee: Secondary | ICD-10-CM | POA: Diagnosis not present

## 2021-01-27 DIAGNOSIS — Z7901 Long term (current) use of anticoagulants: Secondary | ICD-10-CM

## 2021-01-27 NOTE — Progress Notes (Signed)
PROCEDURE NOTE:  The patient requests injections of the left knee , verbal consent was obtained.  The left knee was prepped appropriately after time out was performed.   Sterile technique was observed and injection of 1 cc of DepoMedrol 40 mg with several cc's of plain xylocaine. Anesthesia was provided by ethyl chloride and a 20-gauge needle was used to inject the knee area. The injection was tolerated well.  A band aid dressing was applied.  The patient was advised to apply ice later today and tomorrow to the injection sight as needed.   Encounter Diagnoses  Name Primary?   Chronic pain of left knee Yes   Anticoagulated    Return in six weeks.  Call if any problem.  Precautions discussed.  Electronically Signed Darreld Mclean, MD 11/10/20228:19 AM

## 2021-03-10 ENCOUNTER — Ambulatory Visit: Payer: Medicare Other | Admitting: Orthopaedic Surgery

## 2021-03-24 ENCOUNTER — Encounter: Payer: Self-pay | Admitting: Orthopaedic Surgery

## 2021-03-24 ENCOUNTER — Other Ambulatory Visit: Payer: Self-pay

## 2021-03-24 ENCOUNTER — Ambulatory Visit (INDEPENDENT_AMBULATORY_CARE_PROVIDER_SITE_OTHER): Payer: Medicare Other | Admitting: Orthopaedic Surgery

## 2021-03-24 DIAGNOSIS — Z7901 Long term (current) use of anticoagulants: Secondary | ICD-10-CM

## 2021-03-24 DIAGNOSIS — G8929 Other chronic pain: Secondary | ICD-10-CM | POA: Diagnosis not present

## 2021-03-24 DIAGNOSIS — M25562 Pain in left knee: Secondary | ICD-10-CM | POA: Diagnosis not present

## 2021-03-24 NOTE — Progress Notes (Signed)
PROCEDURE NOTE:  The patient requests injections of the left knee , verbal consent was obtained.  The left knee was prepped appropriately after time out was performed.   Sterile technique was observed and injection of 1 cc of DepoMedrol 40 mg with several cc's of plain xylocaine. Anesthesia was provided by ethyl chloride and a 20-gauge needle was used to inject the knee area. The injection was tolerated well.  A band aid dressing was applied.  The patient was advised to apply ice later today and tomorrow to the injection sight as needed.  Encounter Diagnoses  Name Primary?   Chronic pain of left knee Yes   Anticoagulated    Return in six weeks.  Call if any problem.  Precautions discussed.  Electronically Signed Darreld Mclean, MD 1/5/20238:49 AM

## 2021-05-05 ENCOUNTER — Ambulatory Visit: Payer: Medicare Other | Admitting: Orthopaedic Surgery

## 2021-05-05 ENCOUNTER — Other Ambulatory Visit: Payer: Self-pay

## 2021-05-05 ENCOUNTER — Encounter: Payer: Self-pay | Admitting: Orthopaedic Surgery

## 2021-05-05 DIAGNOSIS — M25562 Pain in left knee: Secondary | ICD-10-CM

## 2021-05-05 DIAGNOSIS — G8929 Other chronic pain: Secondary | ICD-10-CM | POA: Diagnosis not present

## 2021-05-05 NOTE — Progress Notes (Signed)
PROCEDURE NOTE:  The patient requests injections of the left knee , verbal consent was obtained.  The left knee was prepped appropriately after time out was performed.   Sterile technique was observed and injection of 1 cc of DepoMedrol 40 mg with several cc's of plain xylocaine. Anesthesia was provided by ethyl chloride and a 20-gauge needle was used to inject the knee area. The injection was tolerated well.  A band aid dressing was applied.  The patient was advised to apply ice later today and tomorrow to the injection sight as needed.  Encounter Diagnosis  Name Primary?   Chronic pain of left knee Yes   Return in six weeks.  Call if any problem.  Precautions discussed.  Electronically Signed Darreld Mclean, MD 2/16/20238:11 AM

## 2021-06-16 ENCOUNTER — Encounter: Payer: Self-pay | Admitting: Orthopaedic Surgery

## 2021-06-16 ENCOUNTER — Ambulatory Visit: Payer: Medicare Other | Admitting: Orthopaedic Surgery

## 2021-06-16 VITALS — BP 138/78 | HR 67 | Ht 67.0 in | Wt 232.0 lb

## 2021-06-16 DIAGNOSIS — M25562 Pain in left knee: Secondary | ICD-10-CM | POA: Diagnosis not present

## 2021-06-16 DIAGNOSIS — G8929 Other chronic pain: Secondary | ICD-10-CM

## 2021-06-16 NOTE — Progress Notes (Signed)
PROCEDURE NOTE: ? ?The patient requests injections of the left knee , verbal consent was obtained. ? ?The left knee was prepped appropriately after time out was performed.  ? ?Sterile technique was observed and injection of 1 cc of DepoMedrol 40 mg with several cc's of plain xylocaine. Anesthesia was provided by ethyl chloride and a 20-gauge needle was used to inject the knee area. The injection was tolerated well.  A band aid dressing was applied. ? ?The patient was advised to apply ice later today and tomorrow to the injection sight as needed. ? ?Encounter Diagnosis  ?Name Primary?  ? Chronic pain of left knee Yes  ? ?Return in five weeks. ? ?Call if any problem. ? ?Precautions discussed. ? ?Electronically Signed ?Darreld Mclean, MD ?3/30/20238:32 AM ? ?

## 2021-07-21 ENCOUNTER — Ambulatory Visit: Payer: Medicare Other | Admitting: Orthopaedic Surgery

## 2021-07-21 ENCOUNTER — Encounter: Payer: Self-pay | Admitting: Orthopaedic Surgery

## 2021-07-21 DIAGNOSIS — M25562 Pain in left knee: Secondary | ICD-10-CM

## 2021-07-21 DIAGNOSIS — G8929 Other chronic pain: Secondary | ICD-10-CM

## 2021-07-21 DIAGNOSIS — Z7901 Long term (current) use of anticoagulants: Secondary | ICD-10-CM

## 2021-07-21 NOTE — Progress Notes (Signed)
PROCEDURE NOTE: ? ?The patient requests injections of the left knee , verbal consent was obtained. ? ?The left knee was prepped appropriately after time out was performed.  ? ?Sterile technique was observed and injection of 1 cc of DepoMedrol 40 mg with several cc's of plain xylocaine. Anesthesia was provided by ethyl chloride and a 20-gauge needle was used to inject the knee area. The injection was tolerated well.  A band aid dressing was applied. ? ?The patient was advised to apply ice later today and tomorrow to the injection sight as needed. ? ?Encounter Diagnoses  ?Name Primary?  ? Chronic pain of left knee Yes  ? Anticoagulated   ? ?Return in five weeks. ? ?Call if any problem. ? ?Precautions discussed. ? ?Electronically Signed ?Darreld Mclean, MD ?5/4/20238:27 AM ? ?

## 2021-08-23 ENCOUNTER — Encounter: Payer: Self-pay | Admitting: *Deleted

## 2021-08-25 ENCOUNTER — Ambulatory Visit: Payer: Medicare Other | Admitting: Orthopaedic Surgery

## 2021-09-27 ENCOUNTER — Ambulatory Visit (INDEPENDENT_AMBULATORY_CARE_PROVIDER_SITE_OTHER): Payer: Medicare Other | Admitting: Orthopaedic Surgery

## 2021-09-27 ENCOUNTER — Encounter: Payer: Self-pay | Admitting: Orthopaedic Surgery

## 2021-09-27 DIAGNOSIS — M25562 Pain in left knee: Secondary | ICD-10-CM | POA: Diagnosis not present

## 2021-09-27 DIAGNOSIS — Z7901 Long term (current) use of anticoagulants: Secondary | ICD-10-CM

## 2021-09-27 DIAGNOSIS — G8929 Other chronic pain: Secondary | ICD-10-CM

## 2021-09-27 NOTE — Progress Notes (Signed)
PROCEDURE NOTE:  The patient requests injections of the left knee , verbal consent was obtained.  The left knee was prepped appropriately after time out was performed.   Sterile technique was observed and injection of 1 cc of DepoMedrol 40 mg with several cc's of plain xylocaine. Anesthesia was provided by ethyl chloride and a 20-gauge needle was used to inject the knee area. The injection was tolerated well.  A band aid dressing was applied.  The patient was advised to apply ice later today and tomorrow to the injection sight as needed.  Encounter Diagnoses  Name Primary?   Chronic pain of left knee Yes   Anticoagulated    Return in six weeks.  Call if any problem.  Precautions discussed.  Electronically Signed Darreld Mclean, MD 7/11/20238:57 AM

## 2021-11-08 ENCOUNTER — Ambulatory Visit: Payer: Medicare Other | Admitting: Orthopaedic Surgery

## 2021-11-16 ENCOUNTER — Ambulatory Visit: Payer: Medicare Other | Admitting: Orthopaedic Surgery

## 2021-11-16 ENCOUNTER — Encounter: Payer: Self-pay | Admitting: Orthopaedic Surgery

## 2021-11-16 DIAGNOSIS — M25562 Pain in left knee: Secondary | ICD-10-CM | POA: Diagnosis not present

## 2021-11-16 DIAGNOSIS — Z7901 Long term (current) use of anticoagulants: Secondary | ICD-10-CM

## 2021-11-16 DIAGNOSIS — G8929 Other chronic pain: Secondary | ICD-10-CM | POA: Diagnosis not present

## 2021-11-16 MED ORDER — METHYLPREDNISOLONE ACETATE 40 MG/ML IJ SUSP
40.0000 mg | Freq: Once | INTRAMUSCULAR | Status: AC
  Administered 2021-11-16: 40 mg via INTRA_ARTICULAR

## 2021-11-16 NOTE — Addendum Note (Signed)
Addended by: Recardo Evangelist A on: 11/16/2021 04:43 PM   Modules accepted: Orders

## 2021-11-16 NOTE — Progress Notes (Signed)
PROCEDURE NOTE:  The patient requests injections of the left knee , verbal consent was obtained.  The left knee was prepped appropriately after time out was performed.   Sterile technique was observed and injection of 1 cc of DepoMedrol 40 mg with several cc's of plain xylocaine. Anesthesia was provided by ethyl chloride and a 20-gauge needle was used to inject the knee area. The injection was tolerated well.  A band aid dressing was applied.  The patient was advised to apply ice later today and tomorrow to the injection sight as needed.  Encounter Diagnoses  Name Primary?   Chronic pain of left knee Yes   Anticoagulated    Return in six weeks.  Call if any problem.  Precautions discussed.  Electronically Signed Darreld Mclean, MD 8/30/20239:33 AM

## 2021-11-24 ENCOUNTER — Encounter: Payer: Self-pay | Admitting: *Deleted

## 2021-12-05 ENCOUNTER — Ambulatory Visit: Payer: Medicare Other | Admitting: Gastroenterology

## 2021-12-05 ENCOUNTER — Encounter: Payer: Self-pay | Admitting: Gastroenterology

## 2021-12-05 ENCOUNTER — Telehealth: Payer: Self-pay | Admitting: *Deleted

## 2021-12-05 VITALS — BP 132/80 | HR 52 | Temp 97.2°F | Ht 67.0 in | Wt 228.8 lb

## 2021-12-05 DIAGNOSIS — K219 Gastro-esophageal reflux disease without esophagitis: Secondary | ICD-10-CM | POA: Diagnosis not present

## 2021-12-05 DIAGNOSIS — K5900 Constipation, unspecified: Secondary | ICD-10-CM

## 2021-12-05 DIAGNOSIS — Z1211 Encounter for screening for malignant neoplasm of colon: Secondary | ICD-10-CM

## 2021-12-05 DIAGNOSIS — Z800 Family history of malignant neoplasm of digestive organs: Secondary | ICD-10-CM | POA: Diagnosis not present

## 2021-12-05 NOTE — Progress Notes (Signed)
GI Office Note    Referring Provider: Abran Richard, MD Primary Care Physician:  Abran Richard, MD  Primary Gastroenterologist: Dr. Abbey Chatters  Chief Complaint   Chief Complaint  Patient presents with   Colonoscopy    Pt here for colonoscopy. Pt is on a blood thinner     History of Present Illness   Jose Farmer is a 71 y.o. male presenting today at the request of Abran Richard, MD for evaluation prior to scheduling screening colonoscopy.  Last colonoscopy December 2012.  Exam completely normal other than internal hemorrhoids.  Recommended high-fiber diet.  He had Lovenox bridge given that he was on Coumadin.  Last seen in the office 05/01/2018.  He was seen for positive fit test.  He denied any overt hematochezia, melena, fever, chills, abdominal pain, N/A, unintentional weight loss.  He also denied any chest pain, dyspnea, dizziness, lightheadedness, syncope or any other upper or lower GI symptoms.  He did report some known hemorrhoids.  At this time he was on Pradaxa for DVT that occurred in 1991.  It was also reported that since patient's last colonoscopy his sister had developed colon cancer in her late 59s.  He was scheduled for colonoscopy, then had to cancel due to COVID-19.  Procedure does not appear to have been rescheduled.   Today:  GERD: Taking dexilant daily and famotidine nightly at bedtime. Not having breakthrough symptoms. Denis any dysphagia, epigastric pain, N/V. 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   Has a daily bowel movement but does need to strain from time to time. Sometimes it is hard to start but then once he starts he is fine. Does have some hemorrhoids - grade 2. Has some intermittent toilet tissue hematochezia. Denies melena, unintentional weight loss, lack of appetite, early satiety.   Sister had colon cancer that metastasized to her liver.   Still on pradaxa - no bruising episodes or recent  bleeding events. Prescribed by PCP Dr. Yong Channel.   Does have lymphedema  and takes lasix 40 mg daily. Denies chest pain or shortness of breath.    Current Outpatient Medications  Medication Sig Dispense Refill   allopurinol (ZYLOPRIM) 300 MG tablet Take 150 mg by mouth daily.     cetirizine (ZYRTEC) 10 MG tablet Take 10 mg by mouth daily.     Cholecalciferol (VITAMIN D) 50 MCG (2000 UT) tablet Take 2,000 Units by mouth daily.     DEXILANT 60 MG capsule Take 1 capsule by mouth daily.     famotidine (PEPCID) 20 MG tablet Take 1 tablet by mouth as needed.     furosemide (LASIX) 40 MG tablet Take 40 mg by mouth daily.     PRADAXA 150 MG CAPS capsule Take 1 capsule by mouth 2 (two) times daily.     traMADol (ULTRAM) 50 MG tablet Take 1 tablet (50 mg total) by mouth every 6 (six) hours as needed. (Patient not taking: Reported on 12/05/2021) 12 tablet 0   No current facility-administered medications for this visit.    Past Medical History:  Diagnosis Date   DVT (deep venous thrombosis) (HCC)    21 years ago, maintained on Coumadin recently switched to Pradaxa.    GERD (gastroesophageal reflux disease)    Gout    S/P colonoscopy 1998   Dr. Algis Greenhouse, normal   S/P endoscopy Sept 2012   mild gastritis, negative biopsies, short esophagus, not candidate for reflux surgery or Bravo capsule placement  Past Surgical History:  Procedure Laterality Date   APPENDECTOMY     COLONOSCOPY  03/10/2011   Procedure: COLONOSCOPY;  Surgeon: Arlyce Harman, MD;  Location: [facility];  Service: Endoscopy;  Laterality: N/A;  10:00   ESOPHAGECTOMY     ? per pt report   ESOPHAGOGASTRODUODENOSCOPY  12/16/2010   Procedure: ESOPHAGOGASTRODUODENOSCOPY (EGD);  Surgeon: Arlyce Harman, MD;  [facility]: [facility];  Service: Endoscopy;  Laterality: N/A;  2:00   HAND SURGERY     tendons   HERNIA REPAIR     ventral    Family History  Problem Relation Age of Onset   Colon cancer Sister 19   Anesthesia  problems Neg Hx    Hypotension Neg Hx    Malignant hyperthermia Neg Hx    Pseudochol deficiency Neg Hx     Allergies as of 12/05/2021 - Review Complete 12/05/2021  Allergen Reaction Noted   Indomethacin Other (See Comments) 12/14/2010   Latex  03/02/2011    Social History   Socioeconomic History   Marital status: Married    Spouse name: Not on file   Number of children: Not on file   Years of education: Not on file   Highest education level: Not on file  Occupational History   Occupation: disability    Employer: UNEMPLOYED  Tobacco Use   Smoking status: Never   Smokeless tobacco: Never  Substance and Sexual Activity   Alcohol use: No   Drug use: No   Sexual activity: Yes    Birth control/protection: None  Other Topics Concern   Not on file  Social History Narrative   Not on file   Social Determinants of Health   Financial Resource Strain: Not on file  Food Insecurity: Not on file  Transportation Needs: Not on file  Physical Activity: Not on file  Stress: Not on file  Social Connections: Not on file  Intimate Partner Violence: Not on file     Review of Systems   Gen: Denies any fever, chills, fatigue, weight loss, lack of appetite.  CV: Denies chest pain, heart palpitations, peripheral edema, syncope.  Resp: Denies shortness of breath at rest or with exertion. Denies wheezing or cough.  GI: see HPI GU : Denies urinary burning, urinary frequency, urinary hesitancy MS: Denies joint pain, muscle weakness, cramps, or limitation of movement.  Derm: Denies rash, itching, dry skin Psych: Denies depression, anxiety, memory loss, and confusion Heme: Denies bruising, bleeding, and enlarged lymph nodes.   Physical Exam   BP 132/80   Pulse (!) 52   Temp (!) 97.2 F (36.2 C)   Ht 5\' 7"  (1.702 m)   Wt 228 lb 12.8 oz (103.8 kg)   BMI 35.84 kg/m   General:   Alert and oriented. Pleasant and cooperative. Well-nourished and well-developed.  Head:  Normocephalic  and atraumatic. Eyes:  Without icterus, sclera clear and conjunctiva pink.  Ears:  Normal auditory acuity. Lungs:  Clear to auscultation bilaterally. No wheezes, rales, or rhonchi. No distress.  Heart:  S1, S2 present without murmurs appreciated.  Abdomen:  +BS, soft, non-tender and non-distended. No HSM noted. No guarding or rebound. No masses appreciated.  Rectal:  Deferred  Msk:  Symmetrical without gross deformities. Normal posture. Extremities: Nonpitting edema (lymphedema) to bilateral lower extremities Neurologic:  Alert and  oriented x4;  grossly normal neurologically. Skin:  Intact without significant lesions or rashes. Psych:  Alert and cooperative. Normal mood and affect.   Assessment   Jose  Lacretia Nicks Farmer is a 71 y.o. male with a history of DVT in 1991, previously on Coumadin but for the past few years has been on Pradaxa, GERD, and gout presenting today for evaluation prior to scheduling screening colonoscopy.  Screening for colon cancer/family history of colon cancer: Family history of colon cancer in a sister who was diagnosed in her 23s.  Last colonoscopy in number 2012 with only small internal hemorrhoids, otherwise normal.  Noted to have a positive FIT test by PCP in 2020.  He was initially referred to our office then and scheduled procedure, however due to COVID he canceled procedure and did not reschedule.  Currently denies any melena, lack of appetite, early satiety, unintentional weight loss, diarrhea.  No alarm symptoms present.  Does have occasional constipation and intermittent toilet tissue hematochezia as stated below.  Currently on Pradaxa due to history of DVT in 1991.  We will proceed with colonoscopy with propofol with Dr. Marletta Lor in the near future once clearance obtained from PCP to hold Pradaxa for 2 days prior.  GERD: Chronic.  Well-controlled.  Currently maintained on Dexilant 60 mg daily and famotidine 20 mg as needed.  Denies any other upper GI symptoms, no  nausea/vomiting, lack of appetite, or dysphagia.  Constipation: Typically has a daily soft bowel movement but does admit to needing to strain intermittently.  He reports he has difficulty initiating a bowel movement, but is easier after he starts.  Currently not taking anything over-the-counter.  Does have a history of hemorrhoids and has intermittent toilet tissue hematochezia.  Denies any burning or itching.  Advised over-the-counter Colace or MiraLAX as needed, as well as Preparation H over the counter as needed for hemorrhoids.  PLAN   Proceed with colonoscopy with propofol by Dr. Marletta Lor  in near future: the risks, benefits, and alternatives have been discussed with the patient in detail. The patient states understanding and desires to proceed. ASA 3 Clearance from Dr. Durene Cal to hold pradaxa for 2 days prior to procedure. Colace or MiraLAX as needed for constipation. Over-the-counter Preparation H as needed for hemorrhoids Follow-up to be determined after procedure, frequency may need to be increased based on family history.   Brooke Bonito, MSN, FNP-BC, AGACNP-BC [facility]

## 2021-12-05 NOTE — Telephone Encounter (Signed)
Clearance sent to PCP

## 2021-12-05 NOTE — Patient Instructions (Signed)
We are scheduling you for colonoscopy in the near future with Dr. Abbey Chatters after we have received clearance from your PCP to hold your Pradaxa for 2 days prior to the procedure.  Our staff will give you a call once we have received that clearance.  For your constipation: I want you been taking over-the-counter stool softener such as Colace (docusate sodium) or MiraLAX as needed.  This should help you avoid straining.  This in turn should help with your hemorrhoids.  If you begin to have any bleeding or swelling of your hemorrhoids, you may use over-the-counter Preparation H as needed.  Recommendations for follow-up will be determined after colonoscopy.   It was a pleasure to see you today. I want to create trusting relationships with patients. If you receive a survey regarding your visit,  I greatly appreciate you taking time to fill this out on paper or through your MyChart. I value your feedback.  Venetia Night, MSN, FNP-BC, AGACNP-BC [facility]

## 2021-12-05 NOTE — Telephone Encounter (Signed)
  What type of surgery is being performed? Colonoscopy  When is surgery scheduled? TBD  Medication clearance to hold Pradaxa 2 days before  Name of physician performing surgery?  Dr. Ar[facility] Phone: [phone] Fax: [phone]  Anethesia type (none, local, MAC, general)? MAC

## 2021-12-08 ENCOUNTER — Ambulatory Visit: Payer: Medicare Other | Admitting: Gastroenterology

## 2021-12-28 ENCOUNTER — Ambulatory Visit: Payer: Medicare Other | Admitting: Orthopaedic Surgery

## 2022-01-03 NOTE — Telephone Encounter (Signed)
Faxed request again to PCP

## 2022-01-04 ENCOUNTER — Encounter: Payer: Self-pay | Admitting: Orthopaedic Surgery

## 2022-01-04 ENCOUNTER — Ambulatory Visit: Payer: Medicare Other | Admitting: Orthopaedic Surgery

## 2022-01-04 DIAGNOSIS — G8929 Other chronic pain: Secondary | ICD-10-CM | POA: Diagnosis not present

## 2022-01-04 DIAGNOSIS — Z7901 Long term (current) use of anticoagulants: Secondary | ICD-10-CM

## 2022-01-04 DIAGNOSIS — M25562 Pain in left knee: Secondary | ICD-10-CM | POA: Diagnosis not present

## 2022-01-04 MED ORDER — METHYLPREDNISOLONE ACETATE 40 MG/ML IJ SUSP
40.0000 mg | Freq: Once | INTRAMUSCULAR | Status: AC
  Administered 2022-01-04: 40 mg via INTRA_ARTICULAR

## 2022-01-04 NOTE — Progress Notes (Signed)
PROCEDURE NOTE:  The patient requests injections of the left knee , verbal consent was obtained.  The left knee was prepped appropriately after time out was performed.   Sterile technique was observed and injection of 1 cc of DepoMedrol 40 mg with several cc's of plain xylocaine. Anesthesia was provided by ethyl chloride and a 20-gauge needle was used to inject the knee area. The injection was tolerated well.  A band aid dressing was applied.  The patient was advised to apply ice later today and tomorrow to the injection sight as needed.  Encounter Diagnoses  Name Primary?   Chronic pain of left knee Yes   Anticoagulated    Return in six weeks.  Call if any problem.  Precautions discussed.  Electronically Signed Sanjuana Kava, MD 10/18/20238:25 AM

## 2022-01-04 NOTE — Addendum Note (Signed)
Addended by: Obie Dredge A on: 01/04/2022 08:59 AM   Modules accepted: Orders

## 2022-02-07 NOTE — Telephone Encounter (Signed)
Spoke with Dr. Erasmo Leventhal office regarding clearance. Was advised pt was given clearance to hold pradaxa x 2 days prior procedure. They will also resend fax over stating this.

## 2022-02-15 ENCOUNTER — Ambulatory Visit (INDEPENDENT_AMBULATORY_CARE_PROVIDER_SITE_OTHER): Payer: Medicare Other | Admitting: Orthopaedic Surgery

## 2022-02-15 ENCOUNTER — Encounter: Payer: Self-pay | Admitting: Orthopaedic Surgery

## 2022-02-15 DIAGNOSIS — G8929 Other chronic pain: Secondary | ICD-10-CM | POA: Diagnosis not present

## 2022-02-15 DIAGNOSIS — M25562 Pain in left knee: Secondary | ICD-10-CM | POA: Diagnosis not present

## 2022-02-15 MED ORDER — METHYLPREDNISOLONE ACETATE 40 MG/ML IJ SUSP
40.0000 mg | Freq: Once | INTRAMUSCULAR | Status: AC
  Administered 2022-02-15: 40 mg via INTRA_ARTICULAR

## 2022-02-15 NOTE — Progress Notes (Signed)
PROCEDURE NOTE:  The patient requests injections of the left knee , verbal consent was obtained.  The left knee was prepped appropriately after time out was performed.   Sterile technique was observed and injection of 1 cc of DepoMedrol 40 mg with several cc's of plain xylocaine. Anesthesia was provided by ethyl chloride and a 20-gauge needle was used to inject the knee area. The injection was tolerated well.  A band aid dressing was applied.  The patient was advised to apply ice later today and tomorrow to the injection sight as needed.  Encounter Diagnosis  Name Primary?   Chronic pain of left knee Yes   Return in six weeks.  Call if any problem.  Precautions discussed.  Electronically Signed Darreld Mclean, MD 11/29/20238:23 AM

## 2022-02-17 NOTE — Telephone Encounter (Signed)
Called pcp and they are closed. I have refaxed them to have them send clearance over

## 2022-03-08 NOTE — Telephone Encounter (Signed)
Carollee Herter, Dr.Hunter's nurse, called and said that pt had told her and Dr. Durene Cal on 02/20/22, that he DID NOT want to do the colonoscopy because he did not want to come off his medications. She said she also gave him a FIT test but he has not brought the test back in at this time. Carollee Herter is also faxing over the office notes for that day, so we can have documentation that he said he didn't want to do the colonoscopy.

## 2022-03-08 NOTE — Telephone Encounter (Signed)
Spoke to pt's PCP office and gave them information on what was needed. She said she would get it to the nurse.

## 2022-03-23 ENCOUNTER — Encounter: Payer: Self-pay | Admitting: Internal Medicine

## 2022-03-29 ENCOUNTER — Ambulatory Visit: Payer: Medicare Other | Admitting: Orthopaedic Surgery

## 2022-04-04 ENCOUNTER — Ambulatory Visit: Payer: Medicare Other | Admitting: Orthopaedic Surgery

## 2022-04-18 ENCOUNTER — Ambulatory Visit (INDEPENDENT_AMBULATORY_CARE_PROVIDER_SITE_OTHER): Payer: Medicare Other | Admitting: Orthopaedic Surgery

## 2022-04-18 ENCOUNTER — Encounter: Payer: Self-pay | Admitting: Orthopaedic Surgery

## 2022-04-18 DIAGNOSIS — G8929 Other chronic pain: Secondary | ICD-10-CM

## 2022-04-18 DIAGNOSIS — M25562 Pain in left knee: Secondary | ICD-10-CM | POA: Diagnosis not present

## 2022-04-18 DIAGNOSIS — Z7901 Long term (current) use of anticoagulants: Secondary | ICD-10-CM

## 2022-04-18 MED ORDER — METHYLPREDNISOLONE ACETATE 40 MG/ML IJ SUSP
40.0000 mg | Freq: Once | INTRAMUSCULAR | Status: AC
  Administered 2022-04-18: 40 mg via INTRA_ARTICULAR

## 2022-04-18 NOTE — Progress Notes (Signed)
PROCEDURE NOTE:  The patient requests injections of the left knee , verbal consent was obtained.  The left knee was prepped appropriately after time out was performed.   Sterile technique was observed and injection of 1 cc of DepoMedrol 40 mg with several cc's of plain xylocaine. Anesthesia was provided by ethyl chloride and a 20-gauge needle was used to inject the knee area. The injection was tolerated well.  A band aid dressing was applied.  The patient was advised to apply ice later today and tomorrow to the injection sight as needed.  Encounter Diagnoses  Name Primary?   Chronic pain of left knee Yes   Anticoagulated    Return in six weeks.  Call if any problem.  Precautions discussed.  Electronically Signed Sanjuana Kava, MD 1/30/20248:52 AM

## 2022-04-18 NOTE — Addendum Note (Signed)
Addended by: Obie Dredge A on: 04/18/2022 11:06 AM   Modules accepted: Orders

## 2022-04-24 ENCOUNTER — Ambulatory Visit: Payer: Medicare Other | Admitting: Gastroenterology

## 2022-05-02 ENCOUNTER — Ambulatory Visit: Payer: Medicare Other | Admitting: Orthopaedic Surgery

## 2022-05-13 ENCOUNTER — Ambulatory Visit: Payer: Self-pay

## 2022-05-18 ENCOUNTER — Encounter: Payer: Self-pay | Admitting: Radiology

## 2022-05-21 ENCOUNTER — Other Ambulatory Visit: Payer: Self-pay

## 2022-05-21 ENCOUNTER — Ambulatory Visit (INDEPENDENT_AMBULATORY_CARE_PROVIDER_SITE_OTHER): Payer: Medicare Other

## 2022-05-21 ENCOUNTER — Ambulatory Visit
Admission: RE | Admit: 2022-05-21 | Discharge: 2022-05-21 | Disposition: A | Discharge status: Home / Self Care | Payer: Medicare Other | Source: Ambulatory Visit | Attending: Nurse Practitioner | Admitting: Nurse Practitioner

## 2022-05-21 VITALS — BP 127/79 | HR 99 | Temp 99.0°F | Resp 24

## 2022-05-21 DIAGNOSIS — J22 Unspecified acute lower respiratory infection: Secondary | ICD-10-CM | POA: Diagnosis not present

## 2022-05-21 DIAGNOSIS — R0602 Shortness of breath: Secondary | ICD-10-CM

## 2022-05-21 DIAGNOSIS — R059 Cough, unspecified: Secondary | ICD-10-CM | POA: Diagnosis not present

## 2022-05-21 DIAGNOSIS — J90 Pleural effusion, not elsewhere classified: Secondary | ICD-10-CM

## 2022-05-21 MED ORDER — AMOXICILLIN-POT CLAVULANATE 875-125 MG PO TABS: 1.0000 | ORAL_TABLET | Freq: Two times a day (BID) | ORAL | 0 refills | Status: DC

## 2022-05-21 MED ORDER — BENZONATATE 100 MG PO CAPS: 100.0000 mg | ORAL_CAPSULE | Freq: Three times a day (TID) | ORAL | 0 refills | Status: DC

## 2022-05-21 NOTE — ED Triage Notes (Signed)
Pt reports fever 100.4 F, cough, shortness of breath, chest congestion and weakness x 12 days.

## 2022-05-21 NOTE — ED Provider Notes (Signed)
[facility]    CSN: [id number] Arrival date & time: 05/21/22  1141      History   Chief Complaint Chief Complaint  Patient presents with   Fever    Fever head congestion and chest congestion and feeling weak - Entered by patient   Appointment    1200    HPI Jose Farmer is a 72 y.o. male.   The history is provided by the patient.   The patient presents for complaints of fever, cough, shortness of breath, wheezing, and chest congestion along with weakness that been present for the past several days.  Patient states symptoms have been present for approximately 12 days.  He states he has been running a fever with a Tmax 100.4.  Patient states last evening, his shortness of breath worsen, he states that he has shortness of breath even when he is at rest.  He also states that he has had intermittent chest pain that radiates into both his shoulders and arms.  He denies sore throat, ear pain, abdominal pain, nausea, vomiting, or diarrhea.  Patient states that he has been taking Tylenol for his symptoms.  He states prior to his symptoms starting, he cleaned pile of compost.  Past Medical History:  Diagnosis Date   DVT (deep venous thrombosis) ([place])    21 years ago, maintained on Coumadin recently switched to Pradaxa.    GERD (gastroesophageal reflux disease)    Gout    S/P colonoscopy 1998   Dr. 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 (CODE) ([place]) 07/20/2020   Verruca plantaris 07/20/2020   Vitamin D deficiency 07/20/2020   Positive FIT (fecal immunochemical test) 05/01/2018   Family history of colon cancer 05/01/2018   Screening for colon cancer 02/13/2011   Lower abdominal pain 01/31/2011   GERD (gastroesophageal reflux disease) 12/13/2010   Warfarin anticoagulation 12/13/2010   Encounter for screening colonoscopy 12/13/2010   Osteoarthrosis involving lower leg 05/15/2005   Tear of medial cartilage or meniscus of knee, current 05/15/2005    Past Surgical History:  Procedure Laterality Date   APPENDECTOMY     COLONOSCOPY  03/10/2011   Procedure: COLONOSCOPY;  Surgeon: Dorothyann Peng, MD;  Location: [facility];  Service: Endoscopy;  Laterality: N/A;  10:00   ESOPHAGECTOMY     ? per pt report   ESOPHAGOGASTRODUODENOSCOPY  12/16/2010   Procedure: ESOPHAGOGASTRODUODENOSCOPY (EGD);  Surgeon: Dorothyann Peng, MD;  Location: [facility];  Service: Endoscopy;  Laterality: N/A;  2:00   HAND SURGERY     tendons   HERNIA REPAIR     ventral       Home Medications    Prior to Admission medications   Medication Sig Start Date End Date Taking? Authorizing Provider  amoxicillin-clavulanate (AUGMENTIN) 875-125 MG tablet Take 1 tablet by mouth every 12 (twelve) hours. 05/21/22  Yes Elizabethanne Lusher-Warren, Alda Lea, NP  benzonatate (TESSALON) 100 MG capsule Take 1 capsule (100 mg total)  by mouth every 8 (eight) hours. 05/21/22  Yes Aithan Farrelly-Warren, Alda Lea, NP  allopurinol (ZYLOPRIM) 300 MG tablet Take 150 mg by mouth daily.    [provider]  cetirizine (ZYRTEC) 10 MG tablet Take 10 mg by mouth daily. 11/16/17   [provider]  Cholecalciferol (VITAMIN D) 50 MCG (2000 UT) tablet Take 2,000 Units by mouth daily.    [provider]  DEXILANT 60 MG capsule Take 1 capsule by mouth daily. 11/16/17   [provider]  famotidine (PEPCID) 20 MG tablet Take 1 tablet by mouth as  needed. 02/26/18   [provider]  furosemide (LASIX) 40 MG tablet Take 40 mg by mouth daily.    [provider]  PRADAXA 150 MG CAPS capsule Take 1 capsule by mouth 2 (two) times daily. 02/19/18   [provider]    Family History Family History  Problem Relation Age of Onset   Colon cancer Sister 29   Anesthesia problems Neg Hx    Hypotension Neg Hx    Malignant hyperthermia Neg Hx    Pseudochol deficiency Neg Hx    Colon polyps Neg Hx     Social History Social History   Tobacco Use   Smoking status: Never   Smokeless tobacco: Never  Substance Use Topics   Alcohol use: No   Drug use: No     Allergies   Indomethacin and Latex   Review of Systems Review of Systems Per HPI  Physical Exam Triage Vital Signs ED Triage Vitals  Enc Vitals Group     BP 05/21/22 1146 127/79     Pulse Rate 05/21/22 1146 99     Resp 05/21/22 1146 (!) 24     Temp 05/21/22 1146 99 F (37.2 C)     Temp Source 05/21/22 1146 Oral     SpO2 05/21/22 1146 94 %     Weight --      Height --      Head Circumference --      Peak Flow --      Pain Score 05/21/22 1148 0     Pain Loc --      Pain Edu? --      Excl. in [place]? --    No data found.  Updated Vital Signs BP 127/79 (BP Location: Right Arm)   Pulse 99   Temp 99 F (37.2 C) (Oral)   Resp (!) 24   SpO2 94%   Visual Acuity Right Eye Distance:   Left Eye Distance:   Bilateral Distance:    Right Eye Near:   Left Eye Near:    Bilateral Near:     Physical Exam Vitals and nursing note reviewed.  Constitutional:      General: He is not in acute distress.    Appearance: Normal appearance.  HENT:     Head: Normocephalic.     Right Ear: Tympanic membrane, ear canal and external ear normal.     Left Ear: Tympanic membrane, ear canal and external ear normal.     Nose: Nose normal.     Mouth/Throat:     Mouth: Mucous membranes are moist.     Pharynx: Posterior oropharyngeal erythema present.  Eyes:      Extraocular Movements: Extraocular movements intact.     Conjunctiva/sclera: Conjunctivae normal.     Pupils: Pupils are equal, round, and reactive to light.  Cardiovascular:     Rate and Rhythm: Normal rate and regular rhythm.  Pulses: Normal pulses.     Heart sounds: Normal heart sounds.  Pulmonary:     Effort: Pulmonary effort is normal. No respiratory distress.     Breath sounds: Normal breath sounds. No stridor. No wheezing, rhonchi or rales.  Abdominal:     General: Bowel sounds are normal.     Palpations: Abdomen is soft.     Tenderness: There is no abdominal tenderness.  Musculoskeletal:     Cervical back: Normal range of motion.  Lymphadenopathy:     Cervical: No cervical adenopathy.  Skin:    General: Skin is warm and dry.  Neurological:     General: No focal deficit present.     Mental Status: He is alert and oriented to person, place, and time.  Psychiatric:        Mood and Affect: Mood normal.        Behavior: Behavior normal.      UC Treatments / Results  Labs (all labs ordered are listed, but only abnormal results are displayed) Labs Reviewed - No data to display  EKG: Normal sinus rhythm without ectopy.  No STEMI.  No other comparisons available.   Radiology DG Chest 2 View  Result Date: 05/21/2022 CLINICAL DATA:  Cough and shortness of breath. EXAM: CHEST - 2 VIEW COMPARISON:  None Available. FINDINGS: The cardio pericardial silhouette is enlarged. There is pulmonary vascular congestion without overt pulmonary edema. Small bilateral pleural effusions noted. Deformity of multiple right ribs consistent with old fractures. IMPRESSION: Enlarged cardiopericardial silhouette with pulmonary vascular congestion and small bilateral pleural effusions. Electronically Signed   By: Misty Stanley M.D.   On: 05/21/2022 12:20    Procedures Procedures (including critical care time)  Medications Ordered in UC Medications - No data to display  Initial Impression /  Assessment and Plan / UC Course  I have reviewed the triage vital signs and the nursing notes.  Pertinent labs & imaging results that were available during my care of the patient were reviewed by me and considered in my medical decision making (see chart for details).  The patient is well-appearing, he is in no acute distress, vital signs are stable.  Room air O2 sat at 94%.  Chest x-ray did show bilateral small pleural effusions.  His EKG was normal sinus rhythm without ectopy.  Patient's cough and shortness of breath, most likely related to the pleural effusion seen on his x-ray.  However, based on his age and symptoms, will treat respiratory infection empirically with Augmentin 875/125 mg.  For his cough, patient was given Tessalon pearls 100 mg as needed.  Patient was advised that if his symptoms worsen, he will need to go to the emergency department immediately as symptoms may be contributed to worsening pleural effusions.  Patient was advised to follow-up with his primary care physician this week for reevaluation.  Patient is in agreement with this plan of care and verbalizes understanding.  All questions were answered.  Patient is stable for discharge.  Final Clinical Impressions(s) / UC Diagnoses   Final diagnoses:  Lower respiratory infection  Cough, unspecified type  Bilateral pleural effusion  Shortness of breath     Discharge Instructions      Your EKG did not show any abnormalities.  The chest x-ray did show that you have fluid in your lungs.  This most likely is contributing to your cough and shortness of breath. Take medication as prescribed. Increase fluids and allow for plenty of rest. Recommend use of a humidifier  in your home at bedtime during sleep to help with cough. If you develop worsening shortness of breath, difficulty breathing, or wheezing, please go to the emergency department immediately.  Please do not wait until you feel that your symptoms can improve. Please  follow-up with your primary care physician within the next week for reevaluation. Follow-up as needed.     ED Prescriptions     Medication Sig Dispense Auth. Provider   benzonatate (TESSALON) 100 MG capsule Take 1 capsule (100 mg total) by mouth every 8 (eight) hours. 30 capsule Dmari Schubring-Warren, Alda Lea, NP   amoxicillin-clavulanate (AUGMENTIN) 875-125 MG tablet Take 1 tablet by mouth every 12 (twelve) hours. 14 tablet Shaida Route-Warren, Alda Lea, NP      PDMP not reviewed this encounter.   Tish Men, NP 05/21/22 1242

## 2022-05-21 NOTE — Discharge Instructions (Signed)
Your EKG did not show any abnormalities.  The chest x-ray did show that you have fluid in your lungs.  This most likely is contributing to your cough and shortness of breath. Take medication as prescribed. Increase fluids and allow for plenty of rest. Recommend use of a humidifier in your home at bedtime during sleep to help with cough. If you develop worsening shortness of breath, difficulty breathing, or wheezing, please go to the emergency department immediately.  Please do not wait until you feel that your symptoms can improve. Please follow-up with your primary care physician within the next week for reevaluation. Follow-up as needed.

## 2022-05-30 ENCOUNTER — Inpatient Hospital Stay (HOSPITAL_COMMUNITY)
Admission: EM | Admit: 2022-05-30 | Discharge: 2022-06-02 | DRG: 292 | Disposition: A | Discharge status: Home / Self Care | Payer: Medicare Other | Attending: Family Medicine | Admitting: Family Medicine

## 2022-05-30 ENCOUNTER — Encounter (HOSPITAL_COMMUNITY): Payer: Self-pay

## 2022-05-30 ENCOUNTER — Emergency Department (HOSPITAL_COMMUNITY): Payer: Medicare Other

## 2022-05-30 ENCOUNTER — Other Ambulatory Visit: Payer: Self-pay

## 2022-05-30 DIAGNOSIS — Z7901 Long term (current) use of anticoagulants: Secondary | ICD-10-CM

## 2022-05-30 DIAGNOSIS — E871 Hypo-osmolality and hyponatremia: Secondary | ICD-10-CM | POA: Diagnosis present

## 2022-05-30 DIAGNOSIS — I5031 Acute diastolic (congestive) heart failure: Secondary | ICD-10-CM | POA: Diagnosis not present

## 2022-05-30 DIAGNOSIS — Z91040 Latex allergy status: Secondary | ICD-10-CM

## 2022-05-30 DIAGNOSIS — I890 Lymphedema, not elsewhere classified: Secondary | ICD-10-CM

## 2022-05-30 DIAGNOSIS — J9811 Atelectasis: Secondary | ICD-10-CM | POA: Diagnosis present

## 2022-05-30 DIAGNOSIS — I872 Venous insufficiency (chronic) (peripheral): Secondary | ICD-10-CM | POA: Diagnosis present

## 2022-05-30 DIAGNOSIS — E669 Obesity, unspecified: Secondary | ICD-10-CM | POA: Diagnosis present

## 2022-05-30 DIAGNOSIS — R42 Dizziness and giddiness: Secondary | ICD-10-CM

## 2022-05-30 DIAGNOSIS — Z79899 Other long term (current) drug therapy: Secondary | ICD-10-CM

## 2022-05-30 DIAGNOSIS — I509 Heart failure, unspecified: Secondary | ICD-10-CM

## 2022-05-30 DIAGNOSIS — I700 Atherosclerosis of aorta: Secondary | ICD-10-CM | POA: Diagnosis present

## 2022-05-30 DIAGNOSIS — J90 Pleural effusion, not elsewhere classified: Secondary | ICD-10-CM | POA: Diagnosis present

## 2022-05-30 DIAGNOSIS — Z800 Family history of malignant neoplasm of digestive organs: Secondary | ICD-10-CM

## 2022-05-30 DIAGNOSIS — R079 Chest pain, unspecified: Secondary | ICD-10-CM | POA: Diagnosis not present

## 2022-05-30 DIAGNOSIS — Z86718 Personal history of other venous thrombosis and embolism: Secondary | ICD-10-CM

## 2022-05-30 DIAGNOSIS — K219 Gastro-esophageal reflux disease without esophagitis: Secondary | ICD-10-CM | POA: Diagnosis present

## 2022-05-30 DIAGNOSIS — M109 Gout, unspecified: Secondary | ICD-10-CM | POA: Diagnosis present

## 2022-05-30 DIAGNOSIS — Z888 Allergy status to other drugs, medicaments and biological substances status: Secondary | ICD-10-CM

## 2022-05-30 DIAGNOSIS — Z6832 Body mass index (BMI) 32.0-32.9, adult: Secondary | ICD-10-CM

## 2022-05-30 LAB — BASIC METABOLIC PANEL
Anion gap: 11 (ref 5–15)
BUN: 13 mg/dL (ref 8–23)
CO2: 28 mmol/L (ref 22–32)
Calcium: 8.1 mg/dL — ABNORMAL LOW (ref 8.9–10.3)
Chloride: 95 mmol/L — ABNORMAL LOW (ref 98–111)
Creatinine, Ser: 0.98 mg/dL (ref 0.61–1.24)
GFR, Estimated: >60 mL/min (ref >60)
Glucose, Bld: 111 mg/dL — ABNORMAL HIGH (ref 70–99)
Potassium: 3.7 mmol/L (ref 3.5–5.1)
Sodium: 134 mmol/L — ABNORMAL LOW (ref 135–145)

## 2022-05-30 LAB — CBC
HCT: 38.4 % — ABNORMAL LOW (ref 39.0–52.0)
Hemoglobin: 12.3 g/dL — ABNORMAL LOW (ref 13.0–17.0)
MCH: 27.4 pg (ref 26.0–34.0)
MCHC: 32 g/dL (ref 30.0–36.0)
MCV: 85.5 fL (ref 80.0–100.0)
Platelets: 470 K/uL — ABNORMAL HIGH (ref 150–400)
RBC: 4.49 MIL/uL (ref 4.22–5.81)
RDW: 13.2 % (ref 11.5–15.5)
WBC: 13.3 K/uL — ABNORMAL HIGH (ref 4.0–10.5)
nRBC: 0 % (ref 0.0–0.2)

## 2022-05-30 LAB — MAGNESIUM: Magnesium: 2.2 mg/dL (ref 1.7–2.4)

## 2022-05-30 LAB — TROPONIN I (HIGH SENSITIVITY)
Troponin I (High Sensitivity): 3 ng/L
Troponin I (High Sensitivity): 4 ng/L (ref <18)

## 2022-05-30 LAB — BRAIN NATRIURETIC PEPTIDE: B Natriuretic Peptide: 162 pg/mL — ABNORMAL HIGH (ref 0.0–100.0)

## 2022-05-30 MED ORDER — POLYETHYLENE GLYCOL 3350 17 G PO PACK: 17.0000 g | PACK | Freq: Every day | ORAL | Status: DC | PRN

## 2022-05-30 MED ORDER — DABIGATRAN ETEXILATE MESYLATE 150 MG PO CAPS
150.0000 mg | ORAL_CAPSULE | Freq: Two times a day (BID) | ORAL | Status: DC
  Administered 2022-05-30 – 2022-06-02 (×6): 150 mg via ORAL
  Filled 2022-05-30 (×6): qty 1

## 2022-05-30 MED ORDER — FUROSEMIDE 10 MG/ML IJ SOLN
40.0000 mg | Freq: Once | INTRAMUSCULAR | Status: AC
  Administered 2022-05-30: 40 mg via INTRAVENOUS
  Filled 2022-05-30 (×2): qty 4

## 2022-05-30 MED ORDER — ACETAMINOPHEN 650 MG RE SUPP: 650.0000 mg | Freq: Four times a day (QID) | RECTAL | Status: DC | PRN

## 2022-05-30 MED ORDER — ACETAMINOPHEN 325 MG PO TABS: 650.0000 mg | ORAL_TABLET | Freq: Four times a day (QID) | ORAL | Status: DC | PRN

## 2022-05-30 MED ORDER — GUAIFENESIN-DM 100-10 MG/5ML PO SYRP
10.0000 mL | ORAL_SOLUTION | Freq: Four times a day (QID) | ORAL | Status: DC | PRN
  Administered 2022-05-31: 10 mL via ORAL
  Filled 2022-05-30 (×2): qty 10

## 2022-05-30 MED ORDER — POTASSIUM CHLORIDE CRYS ER 20 MEQ PO TBCR
40.0000 meq | EXTENDED_RELEASE_TABLET | Freq: Once | ORAL | Status: AC
  Administered 2022-05-30: 40 meq via ORAL
  Filled 2022-05-30: qty 2

## 2022-05-30 MED ORDER — FUROSEMIDE 10 MG/ML IJ SOLN
40.0000 mg | Freq: Once | INTRAMUSCULAR | Status: AC
  Administered 2022-05-31: 40 mg via INTRAVENOUS
  Filled 2022-05-30: qty 4

## 2022-05-30 NOTE — ED Provider Notes (Signed)
[facility] Provider Note  CSN: [id number] Arrival date & time: 05/30/22 1353  Chief Complaint(s) Shortness of Breath  HPI Jose Farmer is a 72 y.o. 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  Patient reports cough for 2 weeks.  Also reports some shortness of breath.  He reports it is worse with laying flat.  Also worse with exertion.  He has had intermittent fevers, runny nose over the past few days, to 101.  Cough is minimally productive.  He also reports some increased bilateral lower extremity leg swelling.  He takes Lasix for this and has been compliant.  Also reports being compliant with his Pradaxa.  No chest pain.  No nausea or vomiting.  Reports feels a little lightheaded with exertion but no fainting.  Denies similar episode in the past.  Went to urgent care, x-ray was performed that showed bilateral pleural effusions and was sent to the ER   Past Medical History Past Medical History:  Diagnosis Date   DVT (deep venous thrombosis) ([place])    21 years ago, maintained on Coumadin recently switched to Pradaxa.    GERD (gastroesophageal reflux disease)    Gout    S/P colonoscopy 1998   Dr. 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   Raynaud's disease 07/20/2020   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  (CODE) ([place]) 07/20/2020   Verruca plantaris 07/20/2020   Vitamin D deficiency 07/20/2020   Positive FIT (fecal immunochemical test) 05/01/2018   Family history of colon cancer 05/01/2018   Screening for colon cancer 02/13/2011   Lower abdominal pain 01/31/2011   GERD (gastroesophageal reflux disease) 12/13/2010   Warfarin anticoagulation 12/13/2010   Encounter for screening colonoscopy 12/13/2010   Osteoarthrosis involving lower leg 05/15/2005   Tear of medial cartilage or meniscus of knee, current 05/15/2005   Home Medication(s) Prior to Admission medications   Medication Sig Start Date End Date Taking? Authorizing Provider  allopurinol (ZYLOPRIM) 300 MG tablet Take 150 mg by mouth daily.    [provider]  amoxicillin-clavulanate (AUGMENTIN) 875-125 MG tablet Take 1 tablet by mouth every 12 (twelve) hours. 05/21/22   Leath-Warren, Alda Lea, NP  benzonatate (TESSALON) 100 MG capsule Take 1 capsule (100 mg total) by mouth every 8 (eight) hours. 05/21/22   Leath-Warren, Alda Lea, NP  cetirizine (ZYRTEC) 10 MG tablet Take 10 mg by mouth daily. 11/16/17   [provider]  Cholecalciferol (VITAMIN D) 50 MCG (2000 UT) tablet Take 2,000 Units by mouth daily.    [provider]  DEXILANT 60 MG capsule Take 1 capsule by mouth daily. 11/16/17   [provider]  famotidine (PEPCID) 20 MG tablet Take 1 tablet by mouth as needed. 02/26/18   [provider]  furosemide (LASIX) 40 MG tablet Take 40 mg by mouth daily.    [provider]  PRADAXA 150 MG CAPS  capsule Take 1 capsule by mouth 2 (two) times daily. 02/19/18   [provider]                                                                                                                                    Past Surgical History Past Surgical History:  Procedure Laterality Date   APPENDECTOMY     COLONOSCOPY  03/10/2011   Procedure: COLONOSCOPY;  Surgeon: Dorothyann Peng, MD;   [facility];  Service: Endoscopy;  Laterality: N/A;  10:00   ESOPHAGECTOMY     ? per pt report   ESOPHAGOGASTRODUODENOSCOPY  12/16/2010   Procedure: ESOPHAGOGASTRODUODENOSCOPY (EGD);  Surgeon: Dorothyann Peng, MD;  Location[facility];  Service: Endoscopy;  Laterality: N/A;  2:00   HAND SURGERY     tendons   HERNIA REPAIR     ventral   Family History Family History  Problem Relation Age of Onset   Colon cancer Sister 74   Anesthesia problems Neg Hx    Hypotension Neg Hx    Malignant hyperthermia Neg Hx    Pseudochol deficiency Neg Hx    Colon polyps Neg Hx     Social History Social History   Tobacco Use   Smoking status: Never   Smokeless tobacco: Never  Substance Use Topics   Alcohol use: No   Drug use: No   Allergies Indomethacin and Latex  Review of Systems Review of Systems  All other systems reviewed and are negative.   Physical Exam Vital Signs  I have reviewed the triage vital signs BP 134/74 (BP Location: Right Arm)   Pulse 87   Temp 98.9 F (37.2 C) (Oral)   Resp (!) 24   Ht '5\' 7"'$  (1.702 m)   Wt 94.6 kg   SpO2 90%   BMI 32.66 kg/m  Physical Exam Vitals and nursing note reviewed.  Constitutional:      General: He is not in acute distress.    Appearance: Normal appearance.  HENT:     Mouth/Throat:     Mouth: Mucous membranes are moist.  Eyes:     Conjunctiva/sclera: Conjunctivae normal.  Neck:     Vascular: Hepatojugular reflux present.  Cardiovascular:     Rate and Rhythm: Normal rate and regular rhythm.  Pulmonary:     Effort: Pulmonary effort is normal. Tachypnea present. No respiratory distress.     Breath sounds: Examination of the right-lower field reveals decreased breath sounds. Examination of the left-lower field reveals decreased breath sounds. Decreased breath sounds present.  Abdominal:     General: Abdomen is flat.     Palpations: Abdomen is soft.     Tenderness: There is no abdominal tenderness.   Musculoskeletal:     Right lower leg: Edema present.     Left lower leg: Edema present.  Skin:    General: Skin is warm and dry.  Capillary Refill: Capillary refill takes less than 2 seconds.  Neurological:     Mental Status: He is alert and oriented to person, place, and time. Mental status is at baseline.  Psychiatric:        Mood and Affect: Mood normal.        Behavior: Behavior normal.     ED Results and Treatments Labs (all labs ordered are listed, but only abnormal results are displayed) Labs Reviewed  BASIC METABOLIC PANEL - Abnormal; Notable for the following components:      Result Value   Sodium 134 (*)    Chloride 95 (*)    Glucose, Bld 111 (*)    Calcium 8.1 (*)    All other components within normal limits  CBC - Abnormal; Notable for the following components:   WBC 13.3 (*)    Hemoglobin 12.3 (*)    HCT 38.4 (*)    Platelets 470 (*)    All other components within normal limits  BRAIN NATRIURETIC PEPTIDE - Abnormal; Notable for the following components:   B Natriuretic Peptide 162.0 (*)    All other components within normal limits  MAGNESIUM  TROPONIN I (HIGH SENSITIVITY)  TROPONIN I (HIGH SENSITIVITY)                                                                                                                          Radiology DG Chest 2 View  Result Date: 05/30/2022 CLINICAL DATA:  Shortness of breath. EXAM: CHEST - 2 VIEW COMPARISON:  05/21/2022 FINDINGS: Stable surgical changes involving the right hemithorax with loss of volume. Persistent and slightly larger bilateral pleural effusions with overlying atelectasis. No pulmonary edema or pneumothorax. The bony thorax is intact. IMPRESSION: Persistent and slightly larger bilateral pleural effusions with overlying atelectasis. Electronically Signed   By: Marijo Sanes M.D.   On: 05/30/2022 14:34    Pertinent labs & imaging results that were available during my care of the patient were reviewed by me  and considered in my medical decision making (see MDM for details).  Medications Ordered in ED Medications  furosemide (LASIX) injection 40 mg (40 mg Intravenous Given 05/30/22 1556)                                                                                                                                     Procedures Procedures  (including critical care time)  Medical Decision Making / ED Course   MDM:  72 year old male presenting to the emergency department with cough and shortness of breath.  Patient overall well-appearing, not hypoxic.  He is tachypneic and has mild increased work of breathing.  Symptoms concerning for volume overload possibly due to CHF.  He reports compliance with his diuretics.  Bedside ultrasound shows plethoric IVC, difficult windows but possible decreased EF.  He does not have a prior echocardiogram he has no prior history of CHF.  Unclear trigger, has had some recent fevers, which may or may not be related.  Doubt pneumonia, chest x-ray without signs of pneumonia and no focal pulmonary signs.  He is already on Lasix and compliant but still having symptoms.  He is not anasarcic but will also check CMP, hemoglobin to evaluate for anemia or hypoalbuminemia, renal dysfunction.  Patient may end up needing to be admitted given that it is new onset of the symptoms.  Clinical Course as of 05/30/22 1841  Tue May 30, 2022  1840 Discussed with the hospitalist, who will admit the patient for further workup  [WS]    Clinical Course User Index [WS] Truett Mainland Livingston Diones, MD     Additional history obtained: -Additional history obtained from friend -External records from outside source obtained and reviewed including: Chart review including previous notes, labs, imaging, consultation notes including UC note today   Lab Tests: -I ordered, reviewed, and interpreted labs.   The pertinent results include:   Labs Reviewed  BASIC METABOLIC PANEL - Abnormal; Notable  for the following components:      Result Value   Sodium 134 (*)    Chloride 95 (*)    Glucose, Bld 111 (*)    Calcium 8.1 (*)    All other components within normal limits  CBC - Abnormal; Notable for the following components:   WBC 13.3 (*)    Hemoglobin 12.3 (*)    HCT 38.4 (*)    Platelets 470 (*)    All other components within normal limits  BRAIN NATRIURETIC PEPTIDE - Abnormal; Notable for the following components:   B Natriuretic Peptide 162.0 (*)    All other components within normal limits  MAGNESIUM  TROPONIN I (HIGH SENSITIVITY)  TROPONIN I (HIGH SENSITIVITY)    Notable for elevated BNP, normal troponin  Imaging Studies ordered: I ordered imaging studies including CXR On my interpretation imaging demonstrates bilateral pleural effusion I independently visualized and interpreted imaging. I agree with the radiologist interpretation   Medicines ordered and prescription drug management: Meds ordered this encounter  Medications   furosemide (LASIX) injection 40 mg    -I have reviewed the patients home medicines and have made adjustments as needed   Consultations Obtained: I requested consultation with the hospitalist,  and discussed lab and imaging findings as well as pertinent plan - they recommend: admission   Cardiac Monitoring: The patient was maintained on a cardiac monitor.  I personally viewed and interpreted the cardiac monitored which showed an underlying rhythm of: NSR  Social Determinants of Health:  Diagnosis or treatment significantly limited by social determinants of health: obesity   Reevaluation: After the interventions noted above, I reevaluated the patient and found that their symptoms have improved  Co morbidities that complicate the patient evaluation  Past Medical History:  Diagnosis Date   DVT (deep venous thrombosis) ([place])    21 years ago, maintained on Coumadin recently switched to Pradaxa.    GERD (gastroesophageal reflux disease)  Gout    S/P colonoscopy 1998   Dr. Algis Greenhouse, normal   S/P endoscopy Sept 2012   mild gastritis, negative biopsies, short esophagus, not candidate for reflux surgery or Bravo capsule placement      Dispostion: Disposition decision including need for hospitalization was considered, and patient admitted to the hospital.    Final Clinical Impression(s) / ED Diagnoses Final diagnoses:  Acute congestive heart failure, unspecified heart failure type [facility])     This chart was dictated using voice recognition software.  Despite best efforts to proofread,  errors can occur which can change the documentation meaning.    Cristie Hem, MD 05/30/22 [phone]

## 2022-05-30 NOTE — Assessment & Plan Note (Addendum)
Orthostatic dizziness.  Blood pressure systolic 123456.  Blood pressure initially soft.  On Lasix 40 mg daily at baseline.  Not on other blood pressure lowering meds. -Obtain echocardiogram - Orthostatic vitals in a.m.

## 2022-05-30 NOTE — Assessment & Plan Note (Signed)
Atypical.  Appears muscloskeletal from coughing.  Troponin 3 > 4.  EKG unchanged. - Echo.

## 2022-05-30 NOTE — Assessment & Plan Note (Signed)
On Pradaxa for DVT.  Reports compliance.

## 2022-05-30 NOTE — Progress Notes (Signed)
Called for report at this time, nurse was in with another patient. Unable to get report at this time.

## 2022-05-30 NOTE — Assessment & Plan Note (Signed)
Resume allopurinol 

## 2022-05-30 NOTE — Assessment & Plan Note (Addendum)
Symptomatic with dyspnea, orthopnea.  BNP 162, no prior to compare.  No echo on file.  1+ bilateral lower extremity edema, but he has baseline lymphedema, chest x-ray showing persistent and slightly larger bilateral pleural effusions with overlying atelectasis. -Obtain echocardiogram -IV Lasix 40 mg x 2 doses, further doses pending clinical course -Leukocytosis 13.3, at this time no focus of infection identified, Trend for now

## 2022-05-30 NOTE — H&P (Signed)
History and Physical    MASCUD WHICHARD [id number] DOB: 07-05-1950 DOA: 05/30/2022  PCP: Abran Richard, MD   Patient coming from: Home  I have personally briefly reviewed patient's old medical records in [place]  Chief Complaint: Difficulty breathing  HPI: Jose Farmer is a 72 y.o. male with medical history significant for DVT on anticoagulation, gout. Patient presented to the ED with complaints of difficulty breathing, cough, chest pain of 2 weeks duration.  Difficulty breathing is mostly with exertion and improves with rest, also describes orthopnea.  Reports lower bilateral chest pain over the past 2 weeks, present only when he coughs, described as an "ache" also of 2 weeks.  Nonradiating.  Denies abdominal bloating.  He reports bilateral lower extremity swelling worse than baseline.  Reports weight loss due to poor oral intake, from baseline weight of 230s now 213.  He reports intermittent episodes of lightheadedness also over the past 2 weeks present sometimes on standing.  He has been compliant with Lasix 40 mg daily, and Pradaxa.  ED Course: Tachypneic respiratory 20-27.  O2 sats 96 to 100% on room air.  Heart rate 80s.  Blood pressure systolic [id number]. Chest x-ray shows bilateral pleural effusions.  BNP 162.  WBC 13.3. IV Lasix 40 mg x 1 given.  Hospitalist to admit  Review of Systems: As per HPI all other systems reviewed and negative.  Past Medical History:  Diagnosis Date   DVT (deep venous thrombosis) ([facility])    21 years ago, maintained on Coumadin recently switched to Pradaxa.    GERD (gastroesophageal reflux disease)    Gout    S/P colonoscopy 1998   Dr. Algis Greenhouse, normal   S/P endoscopy Sept 2012   mild gastritis, negative biopsies, short esophagus, not candidate for reflux surgery or Bravo capsule placement    Past Surgical History:  Procedure Laterality Date   APPENDECTOMY     COLONOSCOPY  03/10/2011   Procedure: COLONOSCOPY;  Surgeon: Dorothyann Peng, MD;  [facility];  Service: Endoscopy;  Laterality: N/A;  10:00   ESOPHAGECTOMY     ? per pt report   ESOPHAGOGASTRODUODENOSCOPY  12/16/2010   Procedure: ESOPHAGOGASTRODUODENOSCOPY (EGD);  Surgeon: Dorothyann Peng, MD;  [facility];  Service: Endoscopy;  Laterality: N/A;  2:00   HAND SURGERY     tendons   HERNIA REPAIR     ventral     reports that he has never smoked. He has never used smokeless tobacco. He reports that he does not drink alcohol and does not use drugs.  Allergies  Allergen Reactions   Indomethacin Other (See Comments)    Hallucinations    Latex     Stockings for circulation break patient out if they contain latex    Family History  Problem Relation Age of Onset   Colon cancer Sister 12   Anesthesia problems Neg Hx    Hypotension Neg Hx    Malignant hyperthermia Neg Hx    Pseudochol deficiency Neg Hx    Colon polyps Neg Hx     Prior to Admission medications   Medication Sig Start Date End Date Taking? Authorizing Provider  allopurinol (ZYLOPRIM) 300 MG tablet Take 150 mg by mouth daily.    [provider]  amoxicillin-clavulanate (AUGMENTIN) 875-125 MG tablet Take 1 tablet by mouth every 12 (twelve) hours. 05/21/22   Leath-Warren, Alda Lea, NP  benzonatate (TESSALON) 100 MG capsule Take 1 capsule (100 mg total) by mouth every 8 (  eight) hours. 05/21/22   Leath-Warren, Alda Lea, NP  cetirizine (ZYRTEC) 10 MG tablet Take 10 mg by mouth daily. 11/16/17   [provider]  Cholecalciferol (VITAMIN D) 50 MCG (2000 UT) tablet Take 2,000 Units by mouth daily.    [provider]  DEXILANT 60 MG capsule Take 1 capsule by mouth daily. 11/16/17   [provider]  famotidine (PEPCID) 20 MG tablet Take 1 tablet by mouth as needed. 02/26/18   [provider]  furosemide (LASIX) 40 MG tablet Take 40 mg by mouth daily.    [provider]  PRADAXA 150 MG CAPS capsule Take 1 capsule by mouth 2 (two)  times daily. 02/19/18   [provider]    Physical Exam: Vitals:   05/30/22 1403 05/30/22 1404 05/30/22 1500  BP: 127/67  108/62  Pulse: 87  80  Resp: 20  (!) 25  Temp: 98.9 F (37.2 C)    TempSrc: Oral    SpO2: 96%  98%  Weight:  96.9 kg   Height:  '5\' 7"'$  (1.702 m)     Constitutional: NAD, calm, comfortable Vitals:   05/30/22 1403 05/30/22 1404 05/30/22 1500  BP: 127/67  108/62  Pulse: 87  80  Resp: 20  (!) 25  Temp: 98.9 F (37.2 C)    TempSrc: Oral    SpO2: 96%  98%  Weight:  96.9 kg   Height:  '5\' 7"'$  (1.702 m)    Eyes: PERRL, lids and conjunctivae normal ENMT: Mucous membranes are moist. .  Neck: normal, supple, no masses, no thyromegaly Respiratory: clear to auscultation bilaterally, no wheezing, no crackles. Normal respiratory effort. No accessory muscle use.  Cardiovascular: Regular rate and rhythm, no murmurs / rubs / gallops.  1+ pitting bilateral lower extremity edema to upper part of leg, with chronic stasis changes, extremities warm.   Abdomen: no tenderness, no masses palpated. No hepatosplenomegaly. Bowel sounds positive.  Musculoskeletal: no clubbing / cyanosis. No joint deformity upper and lower extremities.  Skin: no rashes, lesions, ulcers. No induration Neurologic: No apparent cranial nerve abnormality, moving extremities spontaneously.  Psychiatric: Normal judgment and insight. Alert and oriented x 3. Normal mood.   Labs on Admission: I have personally reviewed following labs and imaging studies  CBC: Recent Labs  Lab 05/30/22 1436  WBC 13.3*  HGB 12.3*  HCT 38.4*  MCV 85.5  PLT [id number]*   Basic Metabolic Panel: Recent Labs  Lab 05/30/22 1436  NA 134*  K 3.7  CL 95*  CO2 28  GLUCOSE 111*  BUN 13  CREATININE 0.98  CALCIUM 8.1*  MG 2.2   Radiological Exams on Admission: DG Chest 2 View  Result Date: 05/30/2022 CLINICAL DATA:  Shortness of breath. EXAM: CHEST - 2 VIEW COMPARISON:  05/21/2022 FINDINGS: Stable surgical changes  involving the right hemithorax with loss of volume. Persistent and slightly larger bilateral pleural effusions with overlying atelectasis. No pulmonary edema or pneumothorax. The bony thorax is intact. IMPRESSION: Persistent and slightly larger bilateral pleural effusions with overlying atelectasis. Electronically Signed   By: Marijo Sanes M.D.   On: 05/30/2022 14:34    EKG: Independently reviewed.  Sinus rhythm, rate 84, QTc 430, no significant change from prior.  Assessment/Plan Principal Problem:   Bilateral pleural effusion Active Problems:   Dizziness   Chest pain   Long term (current) use of anticoagulants   Gouty arthropathy   Lymphedema  Assessment and Plan: * Bilateral pleural effusion Symptomatic with dyspnea, orthopnea.  BNP  162, no prior to compare.  No echo on file.  1+ bilateral lower extremity edema, but he has baseline lymphedema, chest x-ray showing persistent and slightly larger bilateral pleural effusions with overlying atelectasis. -Obtain echocardiogram -IV Lasix 40 mg x 2 doses, further doses pending clinical course -Leukocytosis 13.3, at this time no focus of infection identified, Trend for now  Chest pain Atypical.  Appears muscloskeletal from coughing.  Troponin 3 > 4.  EKG unchanged. - Echo.  Dizziness Orthostatic dizziness.  Blood pressure systolic 123456.  Blood pressure initially soft.  On Lasix 40 mg daily at baseline.  Not on other blood pressure lowering meds. -Obtain echocardiogram - Orthostatic vitals in a.m.  Long term (current) use of anticoagulants On Pradaxa for DVT.  Reports compliance.  Gouty arthropathy Resume allopurinol   DVT prophylaxis: Pradaxa Code Status: Full code, confirmed with patient at bedside Family Communication: Son Lennette Bihari at bedside. Disposition Plan: ~ 1- 2 days Consults called:  None Admission status:  Obs tele    Author: Bethena Roys, MD 05/30/2022 6:46 PM  For on call review [email or web address].

## 2022-05-30 NOTE — ED Triage Notes (Signed)
Reports was told to come here bc he has fluid build up on his lungs.  Reports sob cough and chest pain x 2 weeks.  Had an xray on 3/3 that showed pulm vasc congestion.

## 2022-05-31 ENCOUNTER — Observation Stay (HOSPITAL_COMMUNITY): Payer: Medicare Other

## 2022-05-31 ENCOUNTER — Other Ambulatory Visit (HOSPITAL_COMMUNITY): Payer: Self-pay | Admitting: *Deleted

## 2022-05-31 ENCOUNTER — Ambulatory Visit: Payer: Medicare Other | Admitting: Orthopaedic Surgery

## 2022-05-31 DIAGNOSIS — K219 Gastro-esophageal reflux disease without esophagitis: Secondary | ICD-10-CM | POA: Diagnosis present

## 2022-05-31 DIAGNOSIS — R0609 Other forms of dyspnea: Secondary | ICD-10-CM

## 2022-05-31 DIAGNOSIS — Z91040 Latex allergy status: Secondary | ICD-10-CM | POA: Diagnosis not present

## 2022-05-31 DIAGNOSIS — I890 Lymphedema, not elsewhere classified: Secondary | ICD-10-CM | POA: Diagnosis present

## 2022-05-31 DIAGNOSIS — Z86718 Personal history of other venous thrombosis and embolism: Secondary | ICD-10-CM | POA: Diagnosis not present

## 2022-05-31 DIAGNOSIS — I700 Atherosclerosis of aorta: Secondary | ICD-10-CM | POA: Diagnosis present

## 2022-05-31 DIAGNOSIS — I872 Venous insufficiency (chronic) (peripheral): Secondary | ICD-10-CM | POA: Diagnosis present

## 2022-05-31 DIAGNOSIS — J9811 Atelectasis: Secondary | ICD-10-CM | POA: Diagnosis present

## 2022-05-31 DIAGNOSIS — J90 Pleural effusion, not elsewhere classified: Secondary | ICD-10-CM | POA: Diagnosis present

## 2022-05-31 DIAGNOSIS — I509 Heart failure, unspecified: Secondary | ICD-10-CM | POA: Diagnosis not present

## 2022-05-31 DIAGNOSIS — I5031 Acute diastolic (congestive) heart failure: Secondary | ICD-10-CM | POA: Diagnosis present

## 2022-05-31 DIAGNOSIS — Z6832 Body mass index (BMI) 32.0-32.9, adult: Secondary | ICD-10-CM | POA: Diagnosis not present

## 2022-05-31 DIAGNOSIS — E669 Obesity, unspecified: Secondary | ICD-10-CM | POA: Diagnosis present

## 2022-05-31 DIAGNOSIS — Z79899 Other long term (current) drug therapy: Secondary | ICD-10-CM | POA: Diagnosis not present

## 2022-05-31 DIAGNOSIS — Z7901 Long term (current) use of anticoagulants: Secondary | ICD-10-CM | POA: Diagnosis not present

## 2022-05-31 DIAGNOSIS — Z888 Allergy status to other drugs, medicaments and biological substances status: Secondary | ICD-10-CM | POA: Diagnosis not present

## 2022-05-31 DIAGNOSIS — R42 Dizziness and giddiness: Secondary | ICD-10-CM | POA: Diagnosis not present

## 2022-05-31 DIAGNOSIS — E871 Hypo-osmolality and hyponatremia: Secondary | ICD-10-CM | POA: Diagnosis present

## 2022-05-31 DIAGNOSIS — M109 Gout, unspecified: Secondary | ICD-10-CM | POA: Diagnosis present

## 2022-05-31 DIAGNOSIS — Z800 Family history of malignant neoplasm of digestive organs: Secondary | ICD-10-CM | POA: Diagnosis not present

## 2022-05-31 LAB — CBC
HCT: 36.4 % — ABNORMAL LOW (ref 39.0–52.0)
Hemoglobin: 11.8 g/dL — ABNORMAL LOW (ref 13.0–17.0)
MCH: 27.3 pg (ref 26.0–34.0)
MCHC: 32.4 g/dL (ref 30.0–36.0)
MCV: 84.1 fL (ref 80.0–100.0)
Platelets: 481 10*3/uL — ABNORMAL HIGH (ref 150–400)
RBC: 4.33 MIL/uL (ref 4.22–5.81)
RDW: 13.3 % (ref 11.5–15.5)
WBC: 13.4 10*3/uL — ABNORMAL HIGH (ref 4.0–10.5)
nRBC: 0 % (ref 0.0–0.2)

## 2022-05-31 LAB — ECHOCARDIOGRAM COMPLETE
Area-P 1/2: 4.21 cm2
Height: 67 in
S' Lateral: 2.7 cm
Weight: 3361.57 oz

## 2022-05-31 LAB — BASIC METABOLIC PANEL
Anion gap: 10 (ref 5–15)
BUN: 16 mg/dL (ref 8–23)
CO2: 26 mmol/L (ref 22–32)
Calcium: 8.2 mg/dL — ABNORMAL LOW (ref 8.9–10.3)
Chloride: 97 mmol/L — ABNORMAL LOW (ref 98–111)
Creatinine, Ser: 0.93 mg/dL (ref 0.61–1.24)
GFR, Estimated: >60 mL/min (ref >60)
Glucose, Bld: 127 mg/dL — ABNORMAL HIGH (ref 70–99)
Potassium: 3.9 mmol/L (ref 3.5–5.1)
Sodium: 133 mmol/L — ABNORMAL LOW (ref 135–145)

## 2022-05-31 LAB — GLUCOSE, CAPILLARY: Glucose-Capillary: 126 mg/dL — ABNORMAL HIGH (ref 70–99)

## 2022-05-31 MED ORDER — LORATADINE 10 MG PO TABS
10.0000 mg | ORAL_TABLET | Freq: Every day | ORAL | Status: DC
  Administered 2022-05-31 – 2022-06-02 (×3): 10 mg via ORAL
  Filled 2022-05-31 (×3): qty 1

## 2022-05-31 MED ORDER — FUROSEMIDE 10 MG/ML IJ SOLN
40.0000 mg | Freq: Two times a day (BID) | INTRAMUSCULAR | Status: DC
  Administered 2022-05-31: 40 mg via INTRAVENOUS
  Filled 2022-05-31: qty 4

## 2022-05-31 MED ORDER — LIVING BETTER WITH HEART FAILURE BOOK: Freq: Once | Status: AC

## 2022-05-31 MED ORDER — PERFLUTREN LIPID MICROSPHERE
1.0000 mL | INTRAVENOUS | Status: AC | PRN
  Administered 2022-05-31: 3 mL via INTRAVENOUS

## 2022-05-31 MED ORDER — FUROSEMIDE 40 MG PO TABS: 40.0000 mg | ORAL_TABLET | Freq: Every day | ORAL | Status: DC

## 2022-05-31 MED ORDER — ALLOPURINOL 100 MG PO TABS
150.0000 mg | ORAL_TABLET | Freq: Every day | ORAL | Status: DC
  Administered 2022-05-31 – 2022-06-02 (×3): 150 mg via ORAL
  Filled 2022-05-31 (×3): qty 2

## 2022-05-31 NOTE — Progress Notes (Signed)
*  PRELIMINARY RESULTS* Echocardiogram 2D Echocardiogram has been performed with [facility].  Samuel Germany 05/31/2022, 2:01 PM

## 2022-05-31 NOTE — Progress Notes (Signed)
   05/31/22 1800  ReDS Vest / Clip  Station Marker B  Ruler Value 43  ReDS Value Range  (low quality)

## 2022-05-31 NOTE — Progress Notes (Signed)
Mobility Specialist Progress Note:    05/31/22 1145  Mobility  Activity Ambulated independently in room (Simultaneous filing. User may not have seen previous data.)  Level of Assistance Independent  Assistive Device None  Distance Ambulated (ft) 100 ft  Activity Response Tolerated well  Mobility Referral Yes  $Mobility charge 1 Mobility   Pt agreeable to mobility session. Tolerated well, asx throughout. Ambulated independently in room, returned to bed. Family present in room, all needs met.  Royetta Crochet Mobility Specialist Please contact via Solicitor or  Rehab office at [phone]

## 2022-05-31 NOTE — TOC Progression Note (Signed)
  Transition of Care [facility]) Screening Note   Patient Details  Name: Jose Farmer Date of Birth: 12/03/1950   Transition of [facility]) CM/SW Contact:    Boneta Lucks, RN Phone Number: 05/31/2022, 1:18 PM  Patient in [place], getting ECHO today. DC planning 1-2 days. CHF book ordered, TOC following.   Transition of Care Department [facility]) has reviewed patient and no TOC needs have been identified at this time. We will continue to monitor patient advancement through interdisciplinary progression rounds. If new patient transition needs arise, please place a TOC consult.      Barriers to Discharge: Continued Medical Work up  Expected Discharge Plan and Services       Living arrangements for the past 2 months: [place]

## 2022-05-31 NOTE — Progress Notes (Signed)
PROGRESS NOTE    Jose Farmer  [id number] DOB: 10-23-1950 DOA: 05/30/2022 PCP: Abran Richard, MD  Brief Narrative:  Jose Farmer is a 72 y.o. 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  Has had good compliance with outpatient medications.  Presented tachypneic but saturating well on room air.  CXR showed bilateral pleural effusions,  BNP was 162 and WBC or 13.3.  Troponin 3>4. Given IV Lasix 40 mg x 1 and hospitalist consulted for admission.  Still appearing volume overloaded after admission, TTE ordered and TED hose provided.  Continuing diuresis with Lasix.  Problem List:   Principal Problem:   Bilateral pleural effusion Active Problems:   Gouty arthropathy   Long term (current) use of anticoagulants   Lymphedema   Dizziness   Chest pain  Assessment and Plan:  Bilateral pleural effusion Unclear etiology, possibly related to volume overload.  Will continue to monitor and repeat CXR tomorrow. - Trend leukocytes, currently elevated - Repeat CXR tomorrow  Volume overload BNP 162 on admission, no baseline known.  Appears volume overloaded on exam.  No prior echo on record.  Does have known lymphedema at baseline. - f/u TTE - TED hose - IV Lasix 40 mg x 2 doses, further doses pending clinical course  Chest pain Atypical in nature.  Appears muscloskeletal from coughing.  Troponin flat and EKG unremarkable on admission.  CTM.  Dizziness Orthostatic dizziness, MAP ranging 70-90, still intermittently soft.  On Lasix 40 mg daily at baseline, currently giving IV doses, will CTM. - f/u orthostatic vitals in AM  Hyponatremia Mild, unchanged from yesterday, likely in the setting of furosemide diuresis. - CTM  Long term (current) use of anticoagulants On dabigitran for DVT several years ago.  Reports good compliance. 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  Consultants:  None  Procedures:  None  Antimicrobials:  None  Subjective: Patient seen and evaluated today with no new acute complaints or concerns. No acute concerns or events noted overnight.  He reports that he is feeling somewhat tired this morning.  He continues to have intermittent cough and some shortness of breath.  Has occasional pleuritic chest pain.  Denies chest pain at rest, palpitations.  Notes that he wears TED hose at home.  Objective: Vitals:   05/30/22 1820 05/30/22 2035 05/31/22 0318 05/31/22 0604  BP: 134/74 122/77 111/68   Pulse: 87 86 82   Resp:  20 18   Temp: 98.9 F (37.2 C) 100.1 F (37.8 C) 99.6 F (37.6 C)   TempSrc: Oral Oral Oral   SpO2: 90% 96% 94%   Weight: 94.6 kg   95.3 kg  Height:        Intake/Output Summary (Last 24 hours) at 05/31/2022 0808 Last data filed at 05/30/2022 2218 Gross per 24 hour  Intake 100 ml  Output 725 ml  Net -625 ml   Filed Weights   05/30/22 1404 05/30/22 1820 05/31/22 0604  Weight:  96.9 kg 94.6 kg 95.3 kg    Examination: General: Obese elderly male resting in bed in no acute distress.  Pleasant and appropriate. HEENT: MMM.  NCAT.  No mucosal pallor.  No scleral icterus. Lungs: Crackles at bilateral lung bases.  Egophony.  Normal work of breathing on room air. Cardiovascular: RRR.  Normal S1/S2.  No murmurs/rubs/gallops. Abdomen: Soft, nondistended.  No tenderness to palpation. Extremities: 3+  peripheral edema bilaterally.  Venous stasis changes over bilateral lower extremities.  Peripheral pulses 2+ bilaterally.  No calf tenderness. Skin: Warm, dry.  No rashes other than venous stasis changes over bilateral lower extremities. Neuro: Alert and oriented x 4.  No neurological deficit.  Data Reviewed: I have personally reviewed following labs and imaging studies.  CBC: Recent Labs  Lab 05/30/22 1436 05/31/22 0428  WBC 13.3* 13.4*  HGB 12.3* 11.8*  HCT 38.4* 36.4*  MCV 85.5 84.1  PLT 470* 123XX123*   Basic Metabolic Panel: Recent Labs  Lab 05/30/22 1436 05/31/22 0428  NA 134* 133*  K 3.7 3.9  CL 95* 97*  CO2 28 26  GLUCOSE 111* 127*  BUN 13 16  CREATININE 0.98 0.93  CALCIUM 8.1* 8.2*  MG 2.2  --    GFR: Estimated Creatinine Clearance: 80.2 mL/min (by C-G formula based on SCr of 0.93 mg/dL). Liver Function Tests: No results for input(s): "AST", "ALT", "ALKPHOS", "BILITOT", "PROT", "ALBUMIN" in the last 168 hours. No results for input(s): "LIPASE", "AMYLASE" in the last 168 hours. No results for input(s): "AMMONIA" in the last 168 hours. Coagulation Profile: No results for input(s): "INR", "PROTIME" in the last 168 hours. Cardiac Enzymes: No results for input(s): "CKTOTAL", "CKMB", "CKMBINDEX", "TROPONINI" in the last 168 hours. BNP (last 3 results) No results for input(s): "PROBNP" in the last 8760 hours. HbA1C: No results for input(s): "HGBA1C" in the last 72 hours. CBG: No results for input(s): "GLUCAP" in the last 168 hours. Lipid Profile: No results for input(s): "CHOL", "HDL", "LDLCALC", "TRIG", "CHOLHDL", "LDLDIRECT" in the last 72 hours. Thyroid Function Tests: No results for input(s): "TSH", "T4TOTAL", "FREET4", "T3FREE", "THYROIDAB" in the last 72 hours. Anemia Panel: No results for input(s): "VITAMINB12", "FOLATE", "FERRITIN", "TIBC", "IRON", "RETICCTPCT" in the last 72 hours. Sepsis Labs: No results for input(s): "PROCALCITON", "LATICACIDVEN" in the  last 168 hours.  No results found for this or any previous visit (from the past 240 hour(s)).   Radiology Studies: DG Chest 2 View  Result Date: 05/30/2022 CLINICAL DATA:  Shortness of breath. EXAM: CHEST - 2 VIEW COMPARISON:  05/21/2022 FINDINGS: Stable surgical changes involving the right hemithorax with loss of volume. Persistent and slightly larger bilateral pleural effusions with overlying atelectasis. No pulmonary edema or pneumothorax. The bony thorax is intact. IMPRESSION: Persistent and slightly larger bilateral pleural effusions with overlying atelectasis. Electronically Signed   By: Marijo Sanes M.D.   On: 05/30/2022 14:34    Scheduled Meds:  dabigatran  150 mg Oral BID   furosemide  40 mg Intravenous Once   Continuous Infusions:   LOS: 0 days   Time spent: 35 minutes  Zander Ingham, MS4 [facility]  If 7PM-7AM, please contact night-coverage [email or web address] 05/31/2022, 8:08 AM

## 2022-05-31 NOTE — Progress Notes (Signed)
Pt. Coughed most of night/shift. Educated patient to try to lay in bed in fowler/high fowlers position to elevate legs due to bilateral lower edema, however, pt. States that he can't sleep lying back because it increases his cough. Assisted patient into a high position in bed to aid sleep. Pt. Comfortable at this time. All belongings within pt.'s reach. Call bell within reach and informed pt. On usage of call bell.   At 2:00am: pt. Lying in high fowler position in bed with eyes closed.   At 2:55a: Sonya from telemetry called to notify that pt. Had a 9 beat run of SVT. Went to assess pt., pt. Sitting/lying high fowler position in bed. Pt. States he was coughing a lot and was trying to get comfortable in bed. No complaints of chest pain or respiratory issues. Pt. States he feels fine, other than cough. Secure chatted MD and notified. No new orders at this time. PRN cough med given. Will continue to monitor.   At 4:00am: pt. Lying in bed with eyes closed resting.   Pt. Sitting on side of the bed. No complaints from pt.

## 2022-06-01 ENCOUNTER — Inpatient Hospital Stay (HOSPITAL_COMMUNITY): Payer: Medicare Other

## 2022-06-01 DIAGNOSIS — Z86718 Personal history of other venous thrombosis and embolism: Secondary | ICD-10-CM

## 2022-06-01 DIAGNOSIS — I872 Venous insufficiency (chronic) (peripheral): Secondary | ICD-10-CM

## 2022-06-01 DIAGNOSIS — I700 Atherosclerosis of aorta: Secondary | ICD-10-CM

## 2022-06-01 LAB — BASIC METABOLIC PANEL
Anion gap: 11 (ref 5–15)
BUN: 19 mg/dL (ref 8–23)
CO2: 27 mmol/L (ref 22–32)
Calcium: 8.1 mg/dL — ABNORMAL LOW (ref 8.9–10.3)
Chloride: 94 mmol/L — ABNORMAL LOW (ref 98–111)
Creatinine, Ser: 1.01 mg/dL (ref 0.61–1.24)
GFR, Estimated: >60 mL/min (ref >60)
Glucose, Bld: 120 mg/dL — ABNORMAL HIGH (ref 70–99)
Potassium: 3.4 mmol/L — ABNORMAL LOW (ref 3.5–5.1)
Sodium: 132 mmol/L — ABNORMAL LOW (ref 135–145)

## 2022-06-01 LAB — LIPID PANEL
Cholesterol: 120 mg/dL (ref 0–200)
HDL: 25 mg/dL — ABNORMAL LOW (ref >40)
LDL Cholesterol: 86 mg/dL (ref 0–99)
Total CHOL/HDL Ratio: 4.8 RATIO
Triglycerides: 44 mg/dL (ref <150)
VLDL: 9 mg/dL (ref 0–40)

## 2022-06-01 LAB — DIFFERENTIAL
Abs Immature Granulocytes: 0.08 10*3/uL — ABNORMAL HIGH (ref 0.00–0.07)
Basophils Absolute: 0 10*3/uL (ref 0.0–0.1)
Basophils Relative: 0 %
Eosinophils Absolute: 0.1 10*3/uL (ref 0.0–0.5)
Eosinophils Relative: 1 %
Immature Granulocytes: 1 %
Lymphocytes Relative: 14 %
Lymphs Abs: 1.7 10*3/uL (ref 0.7–4.0)
Monocytes Absolute: 1.6 10*3/uL — ABNORMAL HIGH (ref 0.1–1.0)
Monocytes Relative: 13 %
Neutro Abs: 9 10*3/uL — ABNORMAL HIGH (ref 1.7–7.7)
Neutrophils Relative %: 71 %

## 2022-06-01 LAB — CBC
HCT: 37.9 % — ABNORMAL LOW (ref 39.0–52.0)
Hemoglobin: 12.1 g/dL — ABNORMAL LOW (ref 13.0–17.0)
MCH: 27.1 pg (ref 26.0–34.0)
MCHC: 31.9 g/dL (ref 30.0–36.0)
MCV: 85 fL (ref 80.0–100.0)
Platelets: 485 10*3/uL — ABNORMAL HIGH (ref 150–400)
RBC: 4.46 MIL/uL (ref 4.22–5.81)
RDW: 13.3 % (ref 11.5–15.5)
WBC: 12.5 10*3/uL — ABNORMAL HIGH (ref 4.0–10.5)
nRBC: 0 % (ref 0.0–0.2)

## 2022-06-01 LAB — MAGNESIUM: Magnesium: 2 mg/dL (ref 1.7–2.4)

## 2022-06-01 LAB — PROCALCITONIN: Procalcitonin: <0.1 ng/mL

## 2022-06-01 MED ORDER — POTASSIUM CHLORIDE CRYS ER 20 MEQ PO TBCR
40.0000 meq | EXTENDED_RELEASE_TABLET | Freq: Once | ORAL | Status: AC
  Administered 2022-06-01: 40 meq via ORAL
  Filled 2022-06-01: qty 2

## 2022-06-01 MED ORDER — FUROSEMIDE 40 MG PO TABS
40.0000 mg | ORAL_TABLET | Freq: Every day | ORAL | Status: DC
  Administered 2022-06-02: 40 mg via ORAL
  Filled 2022-06-01: qty 1

## 2022-06-01 MED ORDER — FUROSEMIDE 10 MG/ML IJ SOLN
40.0000 mg | Freq: Every day | INTRAMUSCULAR | Status: DC
  Administered 2022-06-01: 40 mg via INTRAVENOUS
  Filled 2022-06-01: qty 4

## 2022-06-01 NOTE — Progress Notes (Signed)
Pt ambulating in room independently. Denies pain. He utilizes incentive spirometry. Jose Farmer

## 2022-06-01 NOTE — Progress Notes (Addendum)
Physical Therapy Evaluation Patient Details Name: Jose Farmer MRN: [id number] DOB: 02/23/1951 Today's Date: 06/01/2022  History of Present Illness  Jose Farmer is a 72 y.o. male with medical history significant for DVT on anticoagulation, gout.  Patient presented to the ED with complaints of difficulty breathing, cough, chest pain of 2 weeks duration.  Difficulty breathing is mostly with exertion and improves with rest, also describes orthopnea.  Reports lower bilateral chest pain over the past 2 weeks, present only when he coughs, described as an "ache" also of 2 weeks.  Nonradiating.  Denies abdominal bloating.  He reports bilateral lower extremity swelling worse than baseline.  Reports weight loss due to poor oral intake, from baseline weight of 230s now 213.  He reports intermittent episodes of lightheadedness also over the past 2 weeks present sometimes on standing.  He has been compliant with Lasix 40 mg daily, and Pradaxa.  Clinical Impression   Pt is a 72yo male presenting to [facility] due to reason stated in the HPI.   Pt tolerated today's Physical Therapy Evaluation, demonstrating safe and appropriate functional mobility. Pt performed  independently without AD w/ functional transfers, and 170f independently without AD for gait/ambulation.   Patient is performing at functional baseline, no safety concerns noted throughout evaluation.  Patient is safe to DC home with appropriate services, DME, and any other needs indicated.  PT to sign off at this time.  Please reconsult acute PT services if functional changes occur while admitted.  Thank you. Safe to mobilize with nursing staff and Mobility Specialist.    Recommendations for follow up therapy are one component of a multi-disciplinary discharge planning process, led by the attending physician.  Recommendations may be updated based on patient status, additional functional criteria and insurance  authorization.  Follow Up Recommendations No PT follow up      Assistance Recommended at Discharge None  Patient can return home with the following       Equipment Recommendations None recommended by PT  Recommendations for Other Services       Functional Status Assessment Patient has not had a recent decline in their functional status     Precautions / Restrictions Precautions Precautions: None Restrictions Weight Bearing Restrictions: No      Mobility  Bed Mobility Overal bed mobility: Independent       General bed mobility comments: Received sitting in recliner Patient Response: Flat affect  Transfers Overall transfer level: Independent Equipment used: None       General transfer comment: independent sit/stand from recliner x 2.    Ambulation/Gait Ambulation/Gait assistance: Independent Gait Distance (Feet): 150 Feet Assistive device: None Gait Pattern/deviations: Step-through pattern, Decreased step length - right, Decreased step length - left, Decreased stride length, Decreased dorsiflexion - right, Decreased dorsiflexion - left       General Gait Details: Limited foot clearance and step length secondary to increased B edema.  Stairs        Wheelchair Mobility    Modified Rankin (Stroke Patients Only)       Balance Overall balance assessment: Needs assistance Sitting-balance support: No upper extremity supported Sitting balance-Leahy Scale: Good Sitting balance - Comments: good/good sitting balance in sagittal plane with acceptance of weight appropriately.   Standing balance support: No upper extremity supported Standing balance-Leahy Scale: Good Standing balance comment: good/good in standing balance with mild delay in postural and anticipatory reactions with sudden stops and quick pivots.  Pertinent Vitals/Pain Pain Assessment Pain Assessment: No/denies pain    Home Living Family/patient expects to be discharged  to:: Private residence Living Arrangements: Spouse/significant other;Children Available Help at Discharge: Family Type of Home: Mobile home Home Access: Stairs to enter Entrance Stairs-Rails: Right;Left;Can reach both Entrance Stairs-Number of Steps: 3 STE   Home Layout: One level Home Equipment: None;Other (comment) Additional Comments: "maybe a cane"    Prior Function Prior Level of Function : Independent/Modified Independent             Mobility Comments: Independent without AD for community and household ambulation ADLs Comments: Independent with all ADLs; Son's assist with yard work     Journalist, newspaper        Extremity/Trunk Assessment   Upper Extremity Assessment Upper Extremity Assessment: Overall WFL for tasks assessed    Lower Extremity Assessment Lower Extremity Assessment: Overall WFL for tasks assessed    Cervical / Trunk Assessment Cervical / Trunk Assessment: Kyphotic  Communication   Communication: No difficulties  Cognition Arousal/Alertness: Awake/alert Behavior During Therapy: WFL for tasks assessed/performed Overall Cognitive Status: Within Functional Limits for tasks assessed                       Assessment/Plan    PT Assessment Patient does not need any further PT services  PT Problem List         PT Treatment Interventions      PT Goals (Current goals can be found in the Care Plan section)  Acute Rehab PT Goals Patient Stated Goal: one time visit; No PT pickup    Frequency       Co-evaluation               AM-PAC PT "6 Clicks" Mobility  Outcome Measure Help needed turning from your back to your side while in a flat bed without using bedrails?: None Help needed moving from lying on your back to sitting on the side of a flat bed without using bedrails?: None Help needed moving to and from a bed to a chair (including a wheelchair)?: None Help needed standing up from a chair using your arms (e.g., wheelchair or  bedside chair)?: None Help needed to walk in hospital room?: None Help needed climbing 3-5 steps with a railing? : None 6 Click Score: 24    End of Session Equipment Utilized During Treatment: Gait belt Activity Tolerance: Patient tolerated treatment well Patient left: in chair;with call bell/phone within reach Nurse Communication: Mobility status PT Visit Diagnosis: Muscle weakness (generalized) (M62.81);Other abnormalities of gait and mobility (R26.89)    Time: [id number] PT Time Calculation (min) (ACUTE ONLY): 11 min   Charges:   PT Evaluation $PT Eval Low Complexity: 1 Low          Wonda Olds PT, DPT Physical Therapist with [place] [phone] [id number] office  Wonda Olds 06/01/2022, 1:59 PM

## 2022-06-01 NOTE — Consult Note (Signed)
CARDIOLOGY CONSULT NOTE    Patient ID: Jose Farmer; [id number]; 1951/01/21   Admit date: 05/30/2022 Date of Consult: 06/01/2022  Primary Care Provider: Abran Richard, MD Primary Cardiologist: None Primary Electrophysiologist:     Patient Profile:   Jose Farmer is a 72 y.o. male with a hx of DVT history on [facility], gout who is being seen today for the evaluation of aortic plaque on echocardiogram at the request of Dr. Wynetta Emery.  History of Present Illness:   Mr. Jose Farmer is a 72 year old M known to have DVT history on [facility], gout presented to the ER with DOE, orthopnea and PND associated with cough.  He does have chronic venous insufficiency and leg edema from that. He wears compression socks with some relief in swelling. He has been noticing SOB with activities, orthopnea and PND for the last 3 weeks associated with cough which prompted ER visit. Chest x-ray showed bilateral pleural effusions and BNP showed 162. Echocardiogram showed LVEF 70-75%, normal diastology, normal RV systolic function and grade for layered plaque involving the aortic arch. CVP was 8 mmHg.  EKG shows normal sinus rhythm.  Past Medical History:  Diagnosis Date   DVT (deep venous thrombosis) (HCC)    21 years ago, maintained on Coumadin recently switched to Pradaxa.    GERD (gastroesophageal reflux disease)    Gout    S/P colonoscopy 1998   Dr. Algis Greenhouse, normal   S/P endoscopy Sept 2012   mild gastritis, negative biopsies, short esophagus, not candidate for reflux surgery or Bravo capsule placement    Past Surgical History:  Procedure Laterality Date   APPENDECTOMY     COLONOSCOPY  03/10/2011   Procedure: COLONOSCOPY;  Surgeon: Dorothyann Peng, MD;  Location: [facility];  Service: Endoscopy;  Laterality: N/A;  10:00   ESOPHAGECTOMY     ? per pt report   ESOPHAGOGASTRODUODENOSCOPY  12/16/2010   Procedure: ESOPHAGOGASTRODUODENOSCOPY (EGD);  Surgeon: Dorothyann Peng, MD;  Location: [facility];   Service: Endoscopy;  Laterality: N/A;  2:00   HAND SURGERY     tendons   HERNIA REPAIR     ventral       Inpatient Medications: Scheduled Meds:  allopurinol  150 mg Oral Daily   dabigatran  150 mg Oral BID   furosemide  40 mg Intravenous Daily   loratadine  10 mg Oral Daily   Continuous Infusions:  PRN Meds: acetaminophen **OR** acetaminophen, guaiFENesin-dextromethorphan, polyethylene glycol  Allergies:    Allergies  Allergen Reactions   Indomethacin Other (See Comments)    Hallucinations    Latex     Stockings for circulation break patient out if they contain latex    Social History:   Social History   Socioeconomic History   Marital status: Married    Spouse name: Not on file   Number of children: Not on file   Years of education: Not on file   Highest education level: Not on file  Occupational History   Occupation: disability    Employer: UNEMPLOYED  Tobacco Use   Smoking status: Never   Smokeless tobacco: Never  Substance and Sexual Activity   Alcohol use: No   Drug use: No   Sexual activity: Yes    Birth control/protection: None  Other Topics Concern   Not on file  Social History Narrative   Not on file   Social Determinants of Health   Financial Resource Strain: Not on file  Food Insecurity: No Food Insecurity (06/01/2022)  Hunger Vital Sign    Worried About Running Out of Food in the Last Year: Never true    Ran Out of Food in the Last Year: Never true  Transportation Needs: No Transportation Needs (06/01/2022)   PRAPARE - Hydrologist (Medical): No    Lack of Transportation (Non-Medical): No  Physical Activity: Not on file  Stress: Not on file  Social Connections: Not on file  Intimate Partner Violence: Not At Risk (06/01/2022)   Humiliation, Afraid, Rape, and Kick questionnaire    Fear of Current or Ex-Partner: No    Emotionally Abused: No    Physically Abused: No    Sexually Abused: No    Family History:     Family History  Problem Relation Age of Onset   Colon cancer Sister 38   Anesthesia problems Neg Hx    Hypotension Neg Hx    Malignant hyperthermia Neg Hx    Pseudochol deficiency Neg Hx    Colon polyps Neg Hx      ROS:  Please see the history of present illness.  ROS All other ROS reviewed and negative.     Physical Exam/Data:   Vitals:   05/31/22 1323 05/31/22 2159 06/01/22 0452 06/01/22 0500  BP: 99/84 128/72 125/74   Pulse: 86 94 89   Resp:  18 18   Temp: 98.1 F (36.7 C) 100.1 F (37.8 C) 100 F (37.8 C)   TempSrc:  Oral Oral   SpO2: 95% 95% 94%   Weight:    94.9 kg  Height:        Intake/Output Summary (Last 24 hours) at 06/01/2022 1124 Last data filed at 06/01/2022 0900 Gross per 24 hour  Intake 720 ml  Output 800 ml  Net -80 ml   Filed Weights   05/30/22 1820 05/31/22 0604 06/01/22 0500  Weight: 94.6 kg 95.3 kg 94.9 kg   Body mass index is 32.77 kg/m.  General:  Well nourished, well developed, in no acute distress HEENT: normal Lymph: no adenopathy Neck: Unable to examine JVD due to body habitus Endocrine:  No thryomegaly Vascular: No carotid bruits; FA pulses 2+ bilaterally without bruits  Cardiac:  normal S1, S2; RRR; no murmur  Lungs:  clear to auscultation bilaterally, no wheezing, rhonchi or rales  Abd: soft, nontender, no hepatomegaly  Ext: no edema, discoloration in bilateral lower extremities and has no pitting edema. Musculoskeletal:  No deformities, BUE and BLE strength normal and equal Skin: warm and dry  Neuro:  CNs 2-12 intact, no focal abnormalities noted Psych:  Normal affect   EKG:  The EKG was personally reviewed and demonstrates: Normal sinus rhythm, rate controlled Telemetry:  Telemetry was personally reviewed and demonstrates:    Relevant CV Studies:   Laboratory Data:  Chemistry Recent Labs  Lab 05/30/22 1436 05/31/22 0428 06/01/22 0434  NA 134* 133* 132*  K 3.7 3.9 3.4*  CL 95* 97* 94*  CO2 '28 26 27  '$ GLUCOSE  111* 127* 120*  BUN '13 16 19  '$ CREATININE 0.98 0.93 1.01  CALCIUM 8.1* 8.2* 8.1*  GFRNONAA >60 >60 >60  ANIONGAP '11 10 11    '$ No results for input(s): "PROT", "ALBUMIN", "AST", "ALT", "ALKPHOS", "BILITOT" in the last 168 hours. Hematology Recent Labs  Lab 05/30/22 1436 05/31/22 0428 06/01/22 0434  WBC 13.3* 13.4* 12.5*  RBC 4.49 4.33 4.46  HGB 12.3* 11.8* 12.1*  HCT 38.4* 36.4* 37.9*  MCV 85.5 84.1 85.0  MCH 27.4 27.3 27.1  MCHC 32.0 32.4 31.9  RDW 13.2 13.3 13.3  PLT 470* 481* 485*   Cardiac EnzymesNo results for input(s): "TROPONINI" in the last 168 hours. No results for input(s): "TROPIPOC" in the last 168 hours.  BNP Recent Labs  Lab 05/30/22 1436  BNP 162.0*    DDimer No results for input(s): "DDIMER" in the last 168 hours.  Radiology/Studies:  DG Chest 2 View  Result Date: 06/01/2022 CLINICAL DATA:  Bilateral pleural effusion. EXAM: CHEST - 2 VIEW COMPARISON:  05/30/2022 FINDINGS: Postop changes at the right lung apex, with volume loss. Persistent bilateral pleural effusions with atelectasis in the lung bases. No overt interstitial edema. Heart size normal.  Mediastinal contour stable. No pneumothorax. Postop changes in right ribs. IMPRESSION: Persistent bilateral pleural effusions with bibasilar atelectasis. Electronically Signed   By: Lucrezia Europe M.D.   On: 06/01/2022 08:49   ECHOCARDIOGRAM COMPLETE  Result Date: 05/31/2022    ECHOCARDIOGRAM REPORT   Patient Name:   Jose Farmer Date of Exam: 05/31/2022 Medical Rec #:  [id number]        Height:       67.0 in Accession #:    [id number]       Weight:       210.1 lb Date of Birth:  01/10/1951       BSA:          2.065 m Patient Age:    35 years         BP:           111/68 mmHg Patient Gender: M                HR:           82 bpm. Exam Location:  [facility] Procedure: 2D Echo, Cardiac Doppler and Color Doppler Indications:    Bilateral pleural effusion  History:        Patient has no prior history of Echocardiogram  examinations.                 Risk Factors:Dyslipidemia. Long term (current) use of                 anticoagulants, DVT (deep venous thrombosis) (HCC) (From Hx).  Sonographer:    Alvino Chapel RCS Referring Phys: [phone] [place]  1. Left ventricular ejection fraction, by estimation, is 70 to 75%. The left ventricle has hyperdynamic function. The left ventricle has no regional wall motion abnormalities. Left ventricular diastolic parameters were normal.  2. Right ventricular systolic function is normal. The right ventricular size is normal.  3. Right atrial size was mildly dilated.  4. The mitral valve is normal in structure. No evidence of mitral valve regurgitation. No evidence of mitral stenosis.  5. The aortic valve is tricuspid. There is mild calcification of the aortic valve. Aortic valve regurgitation is not visualized. No aortic stenosis is present.  6. 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  7. The inferior vena cava is dilated in size with >50% respiratory variability, suggesting right atrial pressure of 8 mmHg. Comparison(s): No prior Echocardiogram. FINDINGS  Left Ventricle: Left ventricular ejection fraction, by estimation, is 70 to 75%. The left ventricle has hyperdynamic function. The left ventricle has no regional wall motion abnormalities. Definity contrast agent was given IV to delineate the left ventricular endocardial borders. The left ventricular internal cavity size was normal in size. There is no left ventricular hypertrophy. Left ventricular diastolic parameters were  normal. Right Ventricle: The right ventricular size is normal. No increase in right ventricular wall thickness. Right ventricular systolic function is normal. Left Atrium: Left atrial size was normal in size. Right Atrium: Right atrial size was mildly dilated. Pericardium: There is no evidence of pericardial effusion. Mitral Valve: The mitral valve is normal in structure. No  evidence of mitral valve regurgitation. No evidence of mitral valve stenosis. Tricuspid Valve: The tricuspid valve is normal in structure. Tricuspid valve regurgitation is mild . No evidence of tricuspid stenosis. Aortic Valve: The aortic valve is tricuspid. There is mild calcification of the aortic valve. Aortic valve regurgitation is not visualized. No aortic stenosis is present. Pulmonic Valve: The pulmonic valve was normal in structure. Pulmonic valve regurgitation is trivial. No evidence of pulmonic stenosis. Aorta: The aortic root is normal in size and structure. There is severe (Grade IV) layered plaque involving the aortic arch. Venous: The inferior vena cava is dilated in size with greater than 50% respiratory variability, suggesting right atrial pressure of 8 mmHg. IAS/Shunts: No atrial level shunt detected by color flow Doppler.  LEFT VENTRICLE PLAX 2D LVIDd:         4.80 cm   Diastology LVIDs:         2.70 cm   LV e' medial:    10.20 cm/s LV PW:         1.10 cm   LV E/e' medial:  9.6 LV IVS:        1.10 cm   LV e' lateral:   13.10 cm/s LVOT diam:     2.00 cm   LV E/e' lateral: 7.5 LV SV:         69 LV SV Index:   33 LVOT Area:     3.14 cm  RIGHT VENTRICLE RV S prime:     13.80 cm/s TAPSE (M-mode): 1.6 cm LEFT ATRIUM             Index        RIGHT ATRIUM           Index LA diam:        2.80 cm 1.36 cm/m   RA Area:     20.50 cm LA Vol (A2C):   76.2 ml 36.90 ml/m  RA Volume:   67.20 ml  32.54 ml/m LA Vol (A4C):   52.5 ml 25.42 ml/m LA Biplane Vol: 67.3 ml 32.59 ml/m  AORTIC VALVE LVOT Vmax:   114.00 cm/s LVOT Vmean:  63.500 cm/s LVOT VTI:    0.220 m  AORTA Ao Root diam: 3.20 cm MITRAL VALVE               TRICUSPID VALVE MV Area (PHT): 4.21 cm    TR Peak grad:   21.5 mmHg MV Decel Time: 180 msec    TR Vmax:        232.00 cm/s MV E velocity: 98.00 cm/s MV A velocity: 88.30 cm/s  SHUNTS MV E/A ratio:  1.11        Systemic VTI:  0.22 m                            Systemic Diam: 2.00 cm Ashwath Lasch Priya  Vada Swift Electronically signed by Lorelee Cover Rayya Yagi Signature Date/Time: 05/31/2022/2:15:37 PM    Final    DG Chest 2 View  Result Date: 05/30/2022 CLINICAL DATA:  Shortness of breath. EXAM: CHEST - 2 VIEW COMPARISON:  05/21/2022 FINDINGS: Stable surgical  changes involving the right hemithorax with loss of volume. Persistent and slightly larger bilateral pleural effusions with overlying atelectasis. No pulmonary edema or pneumothorax. The bony thorax is intact. IMPRESSION: Persistent and slightly larger bilateral pleural effusions with overlying atelectasis. Electronically Signed   By: Marijo Sanes M.D.   On: 05/30/2022 14:34    Assessment and Plan:   Patient is a 72 year old M known to have DVT on [facility], gout presented to the hospital with DOE, orthopnea and PND for 3 weeks prior to admission.  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 No surgical intervention is needed as well.  # Bilateral pleural effusions -Echocardiogram showed normal LVEF and normal diastology. Unlikely that pleural effusions are cardiac in etiology. CT chest per primary team.  # DVT in 1990s -Initially was on Coumadin which was switched to Pradaxa.  Follow-up with PCP for further management.  # Chronic venous insufficiency -Elevate legs, wear compression sock and outpatient venous reflux study.  [facility] will sign off.   Medication Recommendations: Continue current medications Other recommendations (labs, testing, etc): None Follow up as an outpatient: None    For questions or updates, please contact [facility] Please consult [email or web address] for contact info under Cardiology/STEMI.   Signed, Vangie Bicker, MD 06/01/2022 11:24 AM

## 2022-06-01 NOTE — Progress Notes (Signed)
PROGRESS NOTE    Jose Farmer  [id number] DOB: [birth year] DOA: 05/30/2022 PCP: Abran Richard, MD  Brief Narrative:  Jose Farmer is a 72 y.o. 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  Has had good compliance with outpatient medications.  Presented tachypneic but saturating well on room air.  CXR showed bilateral pleural effusions,  BNP was 162 and WBC or 13.3.  Troponin 3>4. Given IV Lasix 40 mg x 1 and hospitalist consulted for admission.  Still appearing volume overloaded after admission, TTE ordered and TED hose provided.  CXR on 3/14 unchanged but patient remains asymptomatic.  Cardiology consulted for Grade IV layered aortic arch plaque, no intervention needed and recommended continuing dabigitran.  Continuing diuresis with Lasix.  Problem List:  Principal Problem:   Bilateral pleural effusion Active Problems:   Gouty arthropathy   Long term (current) use of anticoagulants   Lymphedema   Dizziness   Chest pain   Acute heart failure (HCC)  Assessment and Plan:  Bilateral pleural effusion Unclear etiology, possibly related to volume overload.  Unchanged today.  Considered thoracentesis, but patient will require one day off dabigitran prior to low/moderate risk bleeding procedure.  Per cardiology, unlikely that pleural effusions are cardiac in etiology. - Leukocytes stably elevated but procalcitonin WNL, CTM - Repeat CXR is unchanged from 2 days ago - Consider CT chest if not improving, otherwise discharge with outpatient follow up - Robitussin PRN   Volume overload BNP 162 on admission, no baseline known.  EF [id number] and no diastolic dysfunction of TTE.  Appears volume overloaded on exam.  Does have known lymphedema at baseline. - TED hose as tolerated - Continue IV Lasix 40 mg BID  Abnormal echocardiogram TTE showing Severe  (Grade IV) layered plaque involving the aortic arch. - Lipid panel - Cardiology consulted, appreciate recs:  - Continue dabigitran, no indication to add clopidogrel  - No surgical intervention needed  Chest pain Atypical in nature.  Appears muscloskeletal from coughing.  Troponin flat and EKG unremarkable on admission.  CTM.   Dizziness Orthostatic dizziness, MAP ranging 70-90, still intermittently soft.  On Lasix 40 mg daily at baseline, currently giving IV doses, will CTM.  No orthostatic hypotension yesterday when evaluated. - PT evaluation   Hyponatremia Mild, unchanged from yesterday, likely in the setting of furosemide diuresis. - CTM with daily BMP   Long term (current) use of anticoagulants On dabigitran for DVT several years ago.  Reports good compliance. - Continue dabigitran 150 mg BID   Gouty arthropathy - Resume allopurinol 150 mg daily  Obesity BMI 32.77; counseled patient on healthy lifestyle interventions.  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 No acute concerns or events noted overnight.  Reports that he has been able to ambulate better in his room and does not experience shortness of breath with ambulation.  Reports that he has also been coughing intermittently.  Objective: Vitals:   05/31/22 1323 05/31/22 2159 06/01/22 0452 06/01/22 0500  BP: 99/84 128/72 125/74   Pulse: 86 94 89    Resp:  18 18   Temp: 98.1 F (36.7 C) 100.1 F (37.8 C) 100 F (37.8 C)   TempSrc:  Oral Oral   SpO2: 95% 95% 94%   Weight:    94.9 kg  Height:        Intake/Output Summary (Last 24 hours) at 06/01/2022 0740 Last data filed at 05/31/2022 2100 Gross per 24 hour  Intake 720 ml  Output 900 ml  Net -180 ml   Filed Weights   05/30/22 1820 05/31/22 0604 06/01/22 0500  Weight: 94.6 kg 95.3 kg 94.9 kg    Examination: General: Obese elderly male resting in bed in no acute distress.  Pleasant and appropriate. HEENT: MMM.  NCAT.  No mucosal pallor.  No scleral icterus. Lungs: Crackles at bilateral lung bases.  Egophony.  Normal work of breathing on room air. Cardiovascular: RRR.  Normal S1/S2.  No murmurs/rubs/gallops. Abdomen: Soft, nondistended.  No tenderness to palpation. Extremities: 2+ peripheral edema bilaterally, wearing TED hose.  Venous stasis changes over bilateral lower extremities.  Peripheral pulses 2+ bilaterally.  No calf tenderness bilaterally. Skin: Warm, dry.  No rashes or lesions grossly. Neuro: Alert and oriented x 4.  No neurological deficit.  Data Reviewed: I have personally reviewed following labs and imaging studies.  CBC: Recent Labs  Lab 05/30/22 1436 05/31/22 0428 06/01/22 0434  WBC 13.3* 13.4* 12.5*  NEUTROABS  --   --  9.0*  HGB 12.3* 11.8* 12.1*  HCT 38.4* 36.4* 37.9*  MCV 85.5 84.1 85.0  PLT 470* 481* 123456*   Basic Metabolic Panel: Recent Labs  Lab 05/30/22 1436 05/31/22 0428 06/01/22 0434  NA 134* 133* 132*  K 3.7 3.9 3.4*  CL 95* 97* 94*  CO2 '28 26 27  '$ GLUCOSE 111* 127* 120*  BUN '13 16 19  '$ CREATININE 0.98 0.93 1.01  CALCIUM 8.1* 8.2* 8.1*  MG 2.2  --  2.0   GFR: Estimated Creatinine Clearance: 73.6 mL/min (by C-G formula based on SCr of 1.01 mg/dL). Liver Function Tests: No results for input(s): "AST", "ALT", "ALKPHOS", "BILITOT", "PROT", "ALBUMIN" in the last 168 hours. No results for input(s): "LIPASE", "AMYLASE" in the last  168 hours. No results for input(s): "AMMONIA" in the last 168 hours. Coagulation Profile: No results for input(s): "INR", "PROTIME" in the last 168 hours. Cardiac Enzymes: No results for input(s): "CKTOTAL", "CKMB", "CKMBINDEX", "TROPONINI" in the last 168 hours. BNP (last 3 results) No results for input(s): "PROBNP" in the last 8760 hours. HbA1C: No results for input(s): "HGBA1C" in the last 72 hours. CBG: Recent Labs  Lab 05/31/22 1617  GLUCAP 126*   Lipid Profile: No results for input(s): "CHOL", "HDL", "LDLCALC", "TRIG", "CHOLHDL", "LDLDIRECT" in the last 72 hours. Thyroid Function Tests: No results for input(s): "TSH", "T4TOTAL", "FREET4", "T3FREE", "THYROIDAB" in the last 72 hours. Anemia Panel: No results for input(s): "VITAMINB12", "FOLATE", "FERRITIN", "TIBC", "IRON", "RETICCTPCT" in the last 72 hours. Sepsis Labs: Recent Labs  Lab 06/01/22 0434  PROCALCITON <0.10    No results found for this or any previous visit (from the past 240 hour(s)).   Radiology Studies: ECHOCARDIOGRAM COMPLETE  Result Date: 05/31/2022    ECHOCARDIOGRAM REPORT   Patient Name:   SARGON MAGGS Date of Exam: 05/31/2022 Medical Rec #:  [id number]        Height:  67.0 in Accession #:    [id number]       Weight:       210.1 lb Date of Birth:  08/04/1950       BSA:          2.065 m Patient Age:    46 years         BP:           111/68 mmHg Patient Gender: M                HR:           82 bpm. Exam Location:  [facility] Procedure: 2D Echo, Cardiac Doppler and Color Doppler Indications:    Bilateral pleural effusion  History:        Patient has no prior history of Echocardiogram examinations.                 Risk Factors:Dyslipidemia. Long term (current) use of                 anticoagulants, DVT (deep venous thrombosis) (HCC) (From Hx).  Sonographer:    Alvino Chapel RCS Referring Phys: [phone] [place]  1. Left ventricular ejection fraction, by estimation, is 70 to 75%. The  left ventricle has hyperdynamic function. The left ventricle has no regional wall motion abnormalities. Left ventricular diastolic parameters were normal.  2. Right ventricular systolic function is normal. The right ventricular size is normal.  3. Right atrial size was mildly dilated.  4. The mitral valve is normal in structure. No evidence of mitral valve regurgitation. No evidence of mitral stenosis.  5. The aortic valve is tricuspid. There is mild calcification of the aortic valve. Aortic valve regurgitation is not visualized. No aortic stenosis is present.  6. 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  7. The inferior vena cava is dilated in size with >50% respiratory variability, suggesting right atrial pressure of 8 mmHg. Comparison(s): No prior Echocardiogram. FINDINGS  Left Ventricle: Left ventricular ejection fraction, by estimation, is 70 to 75%. The left ventricle has hyperdynamic function. The left ventricle has no regional wall motion abnormalities. Definity contrast agent was given IV to delineate the left ventricular endocardial borders. The left ventricular internal cavity size was normal in size. There is no left ventricular hypertrophy. Left ventricular diastolic parameters were normal. Right Ventricle: The right ventricular size is normal. No increase in right ventricular wall thickness. Right ventricular systolic function is normal. Left Atrium: Left atrial size was normal in size. Right Atrium: Right atrial size was mildly dilated. Pericardium: There is no evidence of pericardial effusion. Mitral Valve: The mitral valve is normal in structure. No evidence of mitral valve regurgitation. No evidence of mitral valve stenosis. Tricuspid Valve: The tricuspid valve is normal in structure. Tricuspid valve regurgitation is mild . No evidence of tricuspid stenosis. Aortic Valve: The aortic valve is tricuspid. There is mild calcification of the aortic valve. Aortic valve  regurgitation is not visualized. No aortic stenosis is present. Pulmonic Valve: The pulmonic valve was normal in structure. Pulmonic valve regurgitation is trivial. No evidence of pulmonic stenosis. Aorta: The aortic root is normal in size and structure. There is severe (Grade IV) layered plaque involving the aortic arch. Venous: The inferior vena cava is dilated in size with greater than 50% respiratory variability, suggesting right atrial pressure of 8 mmHg. IAS/Shunts: No atrial level shunt detected by color flow Doppler.  LEFT VENTRICLE PLAX 2D LVIDd:  4.80 cm   Diastology LVIDs:         2.70 cm   LV e' medial:    10.20 cm/s LV PW:         1.10 cm   LV E/e' medial:  9.6 LV IVS:        1.10 cm   LV e' lateral:   13.10 cm/s LVOT diam:     2.00 cm   LV E/e' lateral: 7.5 LV SV:         69 LV SV Index:   33 LVOT Area:     3.14 cm  RIGHT VENTRICLE RV S prime:     13.80 cm/s TAPSE (M-mode): 1.6 cm LEFT ATRIUM             Index        RIGHT ATRIUM           Index LA diam:        2.80 cm 1.36 cm/m   RA Area:     20.50 cm LA Vol (A2C):   76.2 ml 36.90 ml/m  RA Volume:   67.20 ml  32.54 ml/m LA Vol (A4C):   52.5 ml 25.42 ml/m LA Biplane Vol: 67.3 ml 32.59 ml/m  AORTIC VALVE LVOT Vmax:   114.00 cm/s LVOT Vmean:  63.500 cm/s LVOT VTI:    0.220 m  AORTA Ao Root diam: 3.20 cm MITRAL VALVE               TRICUSPID VALVE MV Area (PHT): 4.21 cm    TR Peak grad:   21.5 mmHg MV Decel Time: 180 msec    TR Vmax:        232.00 cm/s MV E velocity: 98.00 cm/s MV A velocity: 88.30 cm/s  SHUNTS MV E/A ratio:  1.11        Systemic VTI:  0.22 m                            Systemic Diam: 2.00 cm Vishnu Priya Mallipeddi Electronically signed by Lorelee Cover Mallipeddi Signature Date/Time: 05/31/2022/2:15:37 PM    Final    DG Chest 2 View  Result Date: 05/30/2022 CLINICAL DATA:  Shortness of breath. EXAM: CHEST - 2 VIEW COMPARISON:  05/21/2022 FINDINGS: Stable surgical changes involving the right hemithorax with loss of volume.  Persistent and slightly larger bilateral pleural effusions with overlying atelectasis. No pulmonary edema or pneumothorax. The bony thorax is intact. IMPRESSION: Persistent and slightly larger bilateral pleural effusions with overlying atelectasis. Electronically Signed   By: Marijo Sanes M.D.   On: 05/30/2022 14:34    Scheduled Meds:  allopurinol  150 mg Oral Daily   dabigatran  150 mg Oral BID   furosemide  40 mg Intravenous Q12H   loratadine  10 mg Oral Daily   Continuous Infusions:   LOS: 1 day   Time spent: 35 minutes  Sammy Cassar, MS4 [facility]  If 7PM-7AM, please contact night-coverage [email or web address] 06/01/2022, 7:40 AM

## 2022-06-02 DIAGNOSIS — I5031 Acute diastolic (congestive) heart failure: Secondary | ICD-10-CM

## 2022-06-02 NOTE — Discharge Summary (Signed)
Physician Discharge Summary  Jose Farmer [id number] DOB: 30-Jan-1951 DOA: 05/30/2022  PCP: Abran Richard, MD  Admit date: 05/30/2022 Discharge date: 06/02/2022  Admitted From:  Home  Disposition:  Home   Recommendations for Outpatient Follow-up:  Follow up with PCP in 1 weeks Follow up with [facility] for pleural effusions Please check BMP in 1-2 weeks to follow electrolytes and renal function   Discharge Condition: STABLE   CODE STATUS: FULL DIET: 2 gram sodium restricted    Brief Hospitalization Summary: Please see all hospital notes, images, labs for full details of the hospitalization. ADMISSION HPI: Jose Farmer is a 72 y.o. 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  Has had good compliance with outpatient medications.  Presented tachypneic but saturating well on room air.  CXR showed bilateral pleural effusions,  BNP was 162 and WBC or 13.3.  Troponin 3>4. Given IV Lasix 40 mg x 1 and hospitalist consulted for admission.  Still appearing volume overloaded after admission, TTE ordered and TED hose provided.  CXR on 3/14 unchanged but patient remains asymptomatic.  Cardiology consulted for Grade IV layered aortic arch plaque, no intervention needed and recommended continuing dabigitran.  Continuing diuresis with Lasix.   HOSPITAL COURSE BY PROBLEM   Bilateral pleural effusion Unclear etiology, possibly related to volume overload.  Unchanged AND asymptomatic.  Considered thoracentesis, but patient will require one day off dabigitran prior to low/moderate risk bleeding procedure and he is asymptomatic and after discussing with patient he did not want to take bleeding risk associated with procedure if he remained asymptomatic.  Per cardiology, unlikely that pleural effusions are cardiac in etiology. - Leukocytes stably  elevated but procalcitonin WNL, CTM - Repeat CXR is unchanged from admission - Consider outpatient CT chest if not improving, otherwise discharge with outpatient follow up pulmonologist recommended.  Referral made to [facility] for follow up.    Volume overload, Acute HFpEF BNP 162 on admission, no baseline known.  EF [id number] and no diastolic dysfunction of TTE.  Appears volume overloaded on exam.  Does have known lymphedema at baseline. 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 - Lipid panel - Cardiology consulted, appreciate recs:             - Continue dabigitran, no indication to add clopidogrel             - No surgical intervention needed   Chest pain Atypical in nature.  Appears muscloskeletal from coughing.  Troponin flat and EKG unremarkable on admission.   Dizziness Orthostatic dizziness, MAP ranging 70-90, still intermittently soft.  On Lasix 40 mg daily at baseline, currently giving IV doses, will CTM.  No orthostatic hypotension when evaluated. - PT evaluation completed - no PT follow up recommended.    Hyponatremia Mild, unchanged from yesterday, likely in the setting of furosemide diuresis. - stable follow up outpatient with BMP in 1-2 weeks with PCP    Long term (current) use of anticoagulants On dabigitran for DVT several years ago.  Reports good compliance. 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  Discharge Diagnoses:  Principal Problem:   Bilateral pleural effusion Active Problems:   Gouty arthropathy   Long term (current) use of anticoagulants   Lymphedema   Dizziness   Chest pain   Acute heart failure [facility])   Discharge  Instructions: Discharge Instructions     Ambulatory referral to Pulmonology   Complete by: As directed    Reason for referral: Pleural Effusion      Allergies as of 06/02/2022       Reactions   Indomethacin Other (See Comments)   Hallucinations    Latex    Stockings for circulation break patient out if they contain latex        Medication List     STOP taking these medications    benzonatate 100 MG capsule Commonly known as: TESSALON       TAKE these medications    acetaminophen 500 MG tablet Commonly known as: TYLENOL Take 1,000 mg by mouth every 6 (six) hours as needed for moderate pain or fever.   allopurinol 300 MG tablet Commonly known as: ZYLOPRIM Take 150 mg by mouth daily.   cetirizine 10 MG tablet Commonly known as: ZYRTEC Take 10 mg by mouth daily.   Dexilant 60 MG capsule Generic drug: dexlansoprazole Take 1 capsule by mouth daily.   famotidine 20 MG tablet Commonly known as: PEPCID Take 1 tablet by mouth as needed.   furosemide 40 MG tablet Commonly known as: LASIX Take 40 mg by mouth daily.   Pradaxa 150 MG Caps capsule Generic drug: dabigatran Take 1 capsule by mouth 2 (two) times daily.   Vitamin D 50 MCG (2000 UT) tablet Take 2,000 Units by mouth daily.        Follow-up Information     Abran Richard, MD. Schedule an appointment as soon as possible for a visit in 1 week(s).   Specialty: Internal Medicine Why: Hospital Follow Up Contact information: [address] [place] [address] [phone]         [facility]. Schedule an appointment as soon as possible for a visit in 1 month(s).   Specialty: Pulmonology Why: Hospital Follow Up Contact information: [address], [address] [place] [phone]               Allergies  Allergen Reactions   Indomethacin Other (See Comments)    Hallucinations    Latex     Stockings for circulation break patient out  if they contain latex   Allergies as of 06/02/2022       Reactions   Indomethacin Other (See Comments)   Hallucinations    Latex    Stockings for circulation break patient out if they contain latex        Medication List     STOP taking these medications    benzonatate 100 MG capsule Commonly known as: TESSALON       TAKE these medications    acetaminophen 500 MG tablet Commonly known as: TYLENOL Take 1,000 mg by mouth every 6 (six) hours as needed for moderate pain or fever.   allopurinol 300 MG tablet Commonly known as: ZYLOPRIM Take 150 mg by mouth daily.   cetirizine 10 MG tablet Commonly known as: ZYRTEC Take 10 mg by mouth daily.   Dexilant 60 MG capsule Generic drug: dexlansoprazole Take 1 capsule by mouth daily.   famotidine 20 MG tablet Commonly known as: PEPCID Take 1 tablet by  mouth as needed.   furosemide 40 MG tablet Commonly known as: LASIX Take 40 mg by mouth daily.   Pradaxa 150 MG Caps capsule Generic drug: dabigatran Take 1 capsule by mouth 2 (two) times daily.   Vitamin D 50 MCG (2000 UT) tablet Take 2,000 Units by mouth daily.        Procedures/Studies: DG Chest 2 View  Result Date: 06/01/2022 CLINICAL DATA:  Bilateral pleural effusion. EXAM: CHEST - 2 VIEW COMPARISON:  05/30/2022 FINDINGS: Postop changes at the right lung apex, with volume loss. Persistent bilateral pleural effusions with atelectasis in the lung bases. No overt interstitial edema. Heart size normal.  Mediastinal contour stable. No pneumothorax. Postop changes in right ribs. IMPRESSION: Persistent bilateral pleural effusions with bibasilar atelectasis. Electronically Signed   By: Lucrezia Europe M.D.   On: 06/01/2022 08:49   ECHOCARDIOGRAM COMPLETE  Result Date: 05/31/2022    ECHOCARDIOGRAM REPORT   Patient Name:   Jose Farmer Date of Exam: 05/31/2022 Medical Rec #:  [id number]        Height:       67.0 in Accession #:    [id number]       Weight:       210.1 lb Date  of Birth:  08/06/1950       BSA:          2.065 m Patient Age:    14 years         BP:           111/68 mmHg Patient Gender: M                HR:           82 bpm. Exam Location:  [facility] Procedure: 2D Echo, Cardiac Doppler and Color Doppler Indications:    Bilateral pleural effusion  History:        Patient has no prior history of Echocardiogram examinations.                 Risk Factors:Dyslipidemia. Long term (current) use of                 anticoagulants, DVT (deep venous thrombosis) (HCC) (From Hx).  Sonographer:    Alvino Chapel RCS Referring Phys: [phone] [place]  1. Left ventricular ejection fraction, by estimation, is 70 to 75%. The left ventricle has hyperdynamic function. The left ventricle has no regional wall motion abnormalities. Left ventricular diastolic parameters were normal.  2. Right ventricular systolic function is normal. The right ventricular size is normal.  3. Right atrial size was mildly dilated.  4. The mitral valve is normal in structure. No evidence of mitral valve regurgitation. No evidence of mitral stenosis.  5. The aortic valve is tricuspid. There is mild calcification of the aortic valve. Aortic valve regurgitation is not visualized. No aortic stenosis is present.  6. 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  7. The inferior vena cava is dilated in size with >50% respiratory variability, suggesting right atrial pressure of 8 mmHg. Comparison(s): No prior Echocardiogram. FINDINGS  Left Ventricle: Left ventricular ejection fraction, by estimation, is 70 to 75%. The left ventricle has hyperdynamic function. The left ventricle has no regional wall motion abnormalities. Definity contrast agent was given IV to delineate the left ventricular endocardial borders. The left ventricular internal cavity size was normal in size. There is no left ventricular hypertrophy. Left ventricular diastolic parameters were normal. Right Ventricle: The  right ventricular size is normal. No increase in right ventricular wall thickness. Right ventricular systolic function is normal. Left Atrium: Left atrial size was normal in size. Right Atrium: Right atrial size was mildly dilated. Pericardium: There is no evidence of pericardial effusion. Mitral Valve: The mitral valve is normal in structure. No evidence of mitral valve regurgitation. No evidence of mitral valve stenosis. Tricuspid Valve: The tricuspid valve is normal in structure. Tricuspid valve regurgitation is mild . No evidence of tricuspid stenosis. Aortic Valve: The aortic valve is tricuspid. There is mild calcification of the aortic valve. Aortic valve regurgitation is not visualized. No aortic stenosis is present. Pulmonic Valve: The pulmonic valve was normal in structure. Pulmonic valve regurgitation is trivial. No evidence of pulmonic stenosis. Aorta: The aortic root is normal in size and structure. There is severe (Grade IV) layered plaque involving the aortic arch. Venous: The inferior vena cava is dilated in size with greater than 50% respiratory variability, suggesting right atrial pressure of 8 mmHg. IAS/Shunts: No atrial level shunt detected by color flow Doppler.  LEFT VENTRICLE PLAX 2D LVIDd:         4.80 cm   Diastology LVIDs:         2.70 cm   LV e' medial:    10.20 cm/s LV PW:         1.10 cm   LV E/e' medial:  9.6 LV IVS:        1.10 cm   LV e' lateral:   13.10 cm/s LVOT diam:     2.00 cm   LV E/e' lateral: 7.5 LV SV:         69 LV SV Index:   33 LVOT Area:     3.14 cm  RIGHT VENTRICLE RV S prime:     13.80 cm/s TAPSE (M-mode): 1.6 cm LEFT ATRIUM             Index        RIGHT ATRIUM           Index LA diam:        2.80 cm 1.36 cm/m   RA Area:     20.50 cm LA Vol (A2C):   76.2 ml 36.90 ml/m  RA Volume:   67.20 ml  32.54 ml/m LA Vol (A4C):   52.5 ml 25.42 ml/m LA Biplane Vol: 67.3 ml 32.59 ml/m  AORTIC VALVE LVOT Vmax:   114.00 cm/s LVOT Vmean:  63.500 cm/s LVOT VTI:    0.220 m  AORTA  Ao Root diam: 3.20 cm MITRAL VALVE               TRICUSPID VALVE MV Area (PHT): 4.21 cm    TR Peak grad:   21.5 mmHg MV Decel Time: 180 msec    TR Vmax:        232.00 cm/s MV E velocity: 98.00 cm/s MV A velocity: 88.30 cm/s  SHUNTS MV E/A ratio:  1.11        Systemic VTI:  0.22 m                            Systemic Diam: 2.00 cm Vishnu Priya Mallipeddi Electronically signed by Lorelee Cover Mallipeddi Signature Date/Time: 05/31/2022/2:15:37 PM    Final    DG Chest 2 View  Result Date: 05/30/2022 CLINICAL DATA:  Shortness of breath. EXAM: CHEST - 2 VIEW COMPARISON:  05/21/2022 FINDINGS: Stable surgical changes involving the right hemithorax  with loss of volume. Persistent and slightly larger bilateral pleural effusions with overlying atelectasis. No pulmonary edema or pneumothorax. The bony thorax is intact. IMPRESSION: Persistent and slightly larger bilateral pleural effusions with overlying atelectasis. Electronically Signed   By: Marijo Sanes M.D.   On: 05/30/2022 14:34   DG Chest 2 View  Result Date: 05/21/2022 CLINICAL DATA:  Cough and shortness of breath. EXAM: CHEST - 2 VIEW COMPARISON:  None Available. FINDINGS: The cardio pericardial silhouette is enlarged. There is pulmonary vascular congestion without overt pulmonary edema. Small bilateral pleural effusions noted. Deformity of multiple right ribs consistent with old fractures. IMPRESSION: Enlarged cardiopericardial silhouette with pulmonary vascular congestion and small bilateral pleural effusions. Electronically Signed   By: Misty Stanley M.D.   On: 05/21/2022 12:20     Subjective: Pt has no more SOB symptoms, he is ambulating well on room air with no difficulties.  He is agreeable to outpatient pulmonary follow up for pleural effusions.   Discharge Exam: Vitals:   06/01/22 2101 06/02/22 0600  BP: 127/80 109/79  Pulse: 91 89  Resp: 18 16  Temp: 98.4 F (36.9 C) 98.5 F (36.9 C)  SpO2: 97% 96%   Vitals:   06/01/22 0500 06/01/22 1358  06/01/22 2101 06/02/22 0600  BP:  131/85 127/80 109/79  Pulse:  87 91 89  Resp:   18 16  Temp:  (!) 97.5 F (36.4 C) 98.4 F (36.9 C) 98.5 F (36.9 C)  TempSrc:  Oral Oral Oral  SpO2:  100% 97% 96%  Weight: 94.9 kg   95.3 kg  Height:       General: Pt is alert, awake, not in acute distress Cardiovascular: normal S1/S2 +, no rubs, no gallops Respiratory: CTA bilaterally, no wheezing, no rhonchi Abdominal: Soft, NT, ND, bowel sounds + Extremities: TED hoses bilateral with 2+ edema, chronic venous stasis changes; lymphedema; no cyanosis   The results of significant diagnostics from this hospitalization (including imaging, microbiology, ancillary and laboratory) are listed below for reference.     Microbiology: No results found for this or any previous visit (from the past 240 hour(s)).   Labs: BNP (last 3 results) Recent Labs    05/30/22 1436  BNP 0000000*   Basic Metabolic Panel: Recent Labs  Lab 05/30/22 1436 05/31/22 0428 06/01/22 0434  NA 134* 133* 132*  K 3.7 3.9 3.4*  CL 95* 97* 94*  CO2 28 26 27   GLUCOSE 111* 127* 120*  BUN 13 16 19   CREATININE 0.98 0.93 1.01  CALCIUM 8.1* 8.2* 8.1*  MG 2.2  --  2.0   Liver Function Tests: No results for input(s): "AST", "ALT", "ALKPHOS", "BILITOT", "PROT", "ALBUMIN" in the last 168 hours. No results for input(s): "LIPASE", "AMYLASE" in the last 168 hours. No results for input(s): "AMMONIA" in the last 168 hours. CBC: Recent Labs  Lab 05/30/22 1436 05/31/22 0428 06/01/22 0434  WBC 13.3* 13.4* 12.5*  NEUTROABS  --   --  9.0*  HGB 12.3* 11.8* 12.1*  HCT 38.4* 36.4* 37.9*  MCV 85.5 84.1 85.0  PLT 470* 481* 485*   Cardiac Enzymes: No results for input(s): "CKTOTAL", "CKMB", "CKMBINDEX", "TROPONINI" in the last 168 hours. BNP: Invalid input(s): "POCBNP" CBG: Recent Labs  Lab 05/31/22 1617  GLUCAP 126*   D-Dimer No results for input(s): "DDIMER" in the last 72 hours. Hgb A1c No results for input(s): "HGBA1C"  in the last 72 hours. Lipid Profile Recent Labs    06/01/22 0506  CHOL 120  HDL  25*  LDLCALC 86  TRIG 44  CHOLHDL 4.8   Thyroid function studies No results for input(s): "TSH", "T4TOTAL", "T3FREE", "THYROIDAB" in the last 72 hours.  Invalid input(s): "FREET3" Anemia work up No results for input(s): "VITAMINB12", "FOLATE", "FERRITIN", "TIBC", "IRON", "RETICCTPCT" in the last 72 hours. Urinalysis No results found for: "COLORURINE", "APPEARANCEUR", "LABSPEC", "PHURINE", "GLUCOSEU", "HGBUR", "BILIRUBINUR", "KETONESUR", "PROTEINUR", "UROBILINOGEN", "NITRITE", "LEUKOCYTESUR" Sepsis Labs Recent Labs  Lab 05/30/22 1436 05/31/22 0428 06/01/22 0434  WBC 13.3* 13.4* 12.5*   Microbiology No results found for this or any previous visit (from the past 240 hour(s)).  Time coordinating discharge:  44 mins   SIGNED:  Irwin Brakeman, MD  [facility] 06/02/2022, 9:46 AM How to contact the [facility] Attending or Consulting provider [place] or covering provider during after hours [place], for this patient?  Check the care team in [facility] and look for a) attending/consulting [facility] provider listed and b) the [facility] team listed Log into [email or web address] and use [place]'s universal password to access. If you do not have the password, please contact the hospital operator. Locate the [facility] provider you are looking for under [facility] and page to a number that you can be directly reached. If you still have difficulty reaching the provider, please page the [facility] (Director on Call) for the Hospitalists listed on amion for assistance.

## 2022-06-02 NOTE — Care Management Important Message (Signed)
Important Message  Patient Details  Name: Jose Farmer MRN: [id number] Date of Birth: 09-22-1950   Medicare Important Message Given:  Yes     [name] 06/02/2022, 11:48 AM

## 2022-06-02 NOTE — Discharge Instructions (Signed)
°  IMPORTANT INFORMATION: PAY CLOSE ATTENTION  ° °PHYSICIAN DISCHARGE INSTRUCTIONS ° °Follow with Primary care provider  Hunter, Denise, MD  and other consultants as instructed by your Hospitalist Physician ° °SEEK MEDICAL CARE OR RETURN TO EMERGENCY ROOM IF SYMPTOMS COME BACK, WORSEN OR NEW PROBLEM DEVELOPS  ° °Please note: °You were cared for by a hospitalist during your hospital stay. Every effort will be made to forward records to your primary care provider.  You can request that your primary care provider send for your hospital records if they have not received them.  Once you are discharged, your primary care physician will handle any further medical issues. Please note that NO REFILLS for any discharge medications will be authorized once you are discharged, as it is imperative that you return to your primary care physician (or establish a relationship with a primary care physician if you do not have one) for your post hospital discharge needs so that they can reassess your need for medications and monitor your lab values. ° °Please get a complete blood count and chemistry panel checked by your Primary MD at your next visit, and again as instructed by your Primary MD. ° °Get Medicines reviewed and adjusted: °Please take all your medications with you for your next visit with your Primary MD ° °Laboratory/radiological data: °Please request your Primary MD to go over all hospital tests and procedure/radiological results at the follow up, please ask your primary care provider to get all Hospital records sent to his/her office. ° °In some cases, they will be blood work, cultures and biopsy results pending at the time of your discharge. Please request that your primary care provider follow up on these results. ° °If you are diabetic, please bring your blood sugar readings with you to your follow up appointment with primary care.   ° °Please call and make your follow up appointments as soon as possible.   ° °Also Note  the following: °If you experience worsening of your admission symptoms, develop shortness of breath, life threatening emergency, suicidal or homicidal thoughts you must seek medical attention immediately by calling 911 or calling your MD immediately  if symptoms less severe. ° °You must read complete instructions/literature along with all the possible adverse reactions/side effects for all the Medicines you take and that have been prescribed to you. Take any new Medicines after you have completely understood and accpet all the possible adverse reactions/side effects.  ° °Do not drive when taking Pain medications or sleeping medications (Benzodiazepines) ° °Do not take more than prescribed Pain, Sleep and Anxiety Medications. It is not advisable to combine anxiety,sleep and pain medications without talking with your primary care practitioner ° °Special Instructions: If you have smoked or chewed Tobacco  in the last 2 yrs please stop smoking, stop any regular Alcohol  and or any Recreational drug use. ° °Wear Seat belts while driving.  Do not drive if taking any narcotic, mind altering or controlled substances or recreational drugs or alcohol.  ° ° ° ° ° ° °

## 2022-06-06 ENCOUNTER — Ambulatory Visit: Payer: Medicare Other | Admitting: Pulmonary Disease

## 2022-06-06 ENCOUNTER — Encounter: Payer: Self-pay | Admitting: Pulmonary Disease

## 2022-06-06 VITALS — BP 134/80 | HR 82 | Ht 67.0 in | Wt 210.8 lb

## 2022-06-06 DIAGNOSIS — R0609 Other forms of dyspnea: Secondary | ICD-10-CM | POA: Diagnosis not present

## 2022-06-06 DIAGNOSIS — R058 Other specified cough: Secondary | ICD-10-CM | POA: Diagnosis not present

## 2022-06-06 DIAGNOSIS — J90 Pleural effusion, not elsewhere classified: Secondary | ICD-10-CM

## 2022-06-06 NOTE — Patient Instructions (Signed)
Lab tests today  Will arrange for thoracentesis to drain fluid from around your lung and send for testing  Follow up in 3 weeks

## 2022-06-06 NOTE — Progress Notes (Signed)
[facility]  Chief Complaint  Patient presents with   Consult    HFU from [facility] 3/12-3/15 for bilateral pleural effusion  Still has SOB and cough     Past Surgical History:  He  has a past surgical history that includes Appendectomy; Hernia repair; Hand surgery; Esophagogastroduodenoscopy (12/16/2010); Esophagectomy; and Colonoscopy (03/10/2011).  Past Medical History:  Recurrent DVT, Gout, GERD  Constitutional:  BP 134/80   Pulse 82   Ht 5\' 7"  (1.702 m)   Wt 210 lb 12.8 oz (95.6 kg)   SpO2 97% Comment: ra  BMI 33.02 kg/m   Brief Summary:  Jose Farmer is a 72 y.o. male with dyspnea and pleural effusions.      Subjective:   He was in hospital earlier this month for dyspnea and found to have bilateral pleural effusion.  He was seen by cardiology.  Echo showed normal systolic and diastolic function.    He was doing well until about 4 weeks ago.  He was moving compost with his son.  At that time he developed cough and shortness of breath.  He had muscle aches and low grade temperature up to 100.80F.  he doesn't usually bring up phlegm.  He has trouble breathing when laying flat.  He has history of lower leg DVT x 2 and has been on pradaxa.  He had trouble with reflux and esophageal stricture.  He had surgery to remove part of his esophagus.  He never smoked cigarettes.  No history of pneumonia of TB.  He is not aware of liver or kidney problems.  No skin rash or joint swelling.  He has chronic leg swelling.  Physical Exam:   Appearance - well kempt   ENMT - no sinus tenderness, no oral exudate, no LAN, Mallampati 3 airway, no stridor  Respiratory - decreased breath sounds at based bilaterally, no wheezing or rales  CV - s1s2 regular rate and rhythm, no murmurs  Ext - 1+ edema  Skin - no rashes  Psych - normal mood and affect   Pulmonary testing:    Chest Imaging:    Cardiac Tests:  Echo 05/31/22 >> EF 70 to 75%, plaque  on aortic arch  Social History:  He  reports that he has never smoked. He has never used smokeless tobacco. He reports that he does not drink alcohol and does not use drugs.  Family History:  His family history includes Colon cancer (age of onset: 71) in his sister.    Discussion:  He has subacute onset of dyspnea with non-productive cough and reports this started around the time he had exposure to compost.  His most recent chest imaging studies show persistent bilateral pleural effusions.  His recent cardiac assessment was unremarkable.    Assessment/Plan:   Dyspnea on exertion, non-productive cough and bilateral pleural effusions. - will get CBC with diff, CMET, ANA, RF, ESR - will arrange for thoracentesis with IR >> send fluid for glucose, protein, LDH, cell count with differential, gram stain and culture, and cytology - might need additional chest imaging with chest CT - defer additional therapeutics until above tests are completed  History of recurrent DVT. - pradaxa from his PCP - explained he would need to be off pradaxa 2 days prior to having thoracentesis done  Time Spent Involved in Patient Care on Day of Examination:  52 minutes  Follow up:   Patient Instructions  Lab tests today  Will arrange for thoracentesis to  drain fluid from around your lung and send for testing  Follow up in 3 weeks  Medication List:   Allergies as of 06/06/2022       Reactions   Indomethacin Other (See Comments)   Hallucinations    Latex    Stockings for circulation break patient out if they contain latex        Medication List        Accurate as of June 06, 2022 11:38 AM. If you have any questions, ask your nurse or doctor.          acetaminophen 500 MG tablet Commonly known as: TYLENOL Take 1,000 mg by mouth every 6 (six) hours as needed for moderate pain or fever.   allopurinol 300 MG tablet Commonly known as: ZYLOPRIM Take 150 mg by mouth daily.   cetirizine  10 MG tablet Commonly known as: ZYRTEC Take 10 mg by mouth daily.   Dexilant 60 MG capsule Generic drug: dexlansoprazole Take 1 capsule by mouth daily.   famotidine 20 MG tablet Commonly known as: PEPCID Take 1 tablet by mouth as needed.   furosemide 40 MG tablet Commonly known as: LASIX Take 40 mg by mouth daily.   Pradaxa 150 MG Caps capsule Generic drug: dabigatran Take 1 capsule by mouth 2 (two) times daily.   Vitamin D 50 MCG (2000 UT) tablet Take 2,000 Units by mouth daily.        Signature:  Chesley Mires, MD [place] Pager - [phone] 06/06/2022, 11:38 AM

## 2022-06-08 ENCOUNTER — Other Ambulatory Visit: Payer: Self-pay | Admitting: Pulmonary Disease

## 2022-06-08 ENCOUNTER — Ambulatory Visit (HOSPITAL_COMMUNITY)
Admission: RE | Admit: 2022-06-08 | Discharge: 2022-06-08 | Disposition: A | Discharge status: Home / Self Care | Payer: Medicare Other | Source: Ambulatory Visit | Attending: Pulmonary Disease | Admitting: Pulmonary Disease

## 2022-06-08 ENCOUNTER — Encounter (HOSPITAL_COMMUNITY): Payer: Self-pay

## 2022-06-08 DIAGNOSIS — J90 Pleural effusion, not elsewhere classified: Secondary | ICD-10-CM | POA: Diagnosis present

## 2022-06-08 DIAGNOSIS — R0609 Other forms of dyspnea: Secondary | ICD-10-CM

## 2022-06-08 DIAGNOSIS — R058 Other specified cough: Secondary | ICD-10-CM

## 2022-06-08 LAB — CBC WITH DIFFERENTIAL/PLATELET
Basophils Absolute: 0.1 10*3/uL (ref 0.0–0.2)
Basos: 1 %
EOS (ABSOLUTE): 0.1 10*3/uL (ref 0.0–0.4)
Eos: 1 %
Hematocrit: 39 % (ref 37.5–51.0)
Hemoglobin: 12.6 g/dL — ABNORMAL LOW (ref 13.0–17.7)
Immature Grans (Abs): 0.1 10*3/uL (ref 0.0–0.1)
Immature Granulocytes: 1 %
Lymphocytes Absolute: 1.6 10*3/uL (ref 0.7–3.1)
Lymphs: 16 %
MCH: 26.6 pg (ref 26.6–33.0)
MCHC: 32.3 g/dL (ref 31.5–35.7)
MCV: 83 fL (ref 79–97)
Monocytes Absolute: 1.2 10*3/uL — ABNORMAL HIGH (ref 0.1–0.9)
Monocytes: 12 %
Neutrophils Absolute: 6.7 10*3/uL (ref 1.4–7.0)
Neutrophils: 69 %
Platelets: 509 10*3/uL — ABNORMAL HIGH (ref 150–450)
RBC: 4.73 x10E6/uL (ref 4.14–5.80)
RDW: 12.7 % (ref 11.6–15.4)
WBC: 9.7 10*3/uL (ref 3.4–10.8)

## 2022-06-08 LAB — COMPREHENSIVE METABOLIC PANEL
ALT: 16 IU/L (ref 0–44)
AST: 13 IU/L (ref 0–40)
Albumin/Globulin Ratio: 0.8 — ABNORMAL LOW (ref 1.2–2.2)
Albumin: 2.9 g/dL — ABNORMAL LOW (ref 3.8–4.8)
Alkaline Phosphatase: 105 IU/L (ref 44–121)
BUN/Creatinine Ratio: 12 (ref 10–24)
BUN: 12 mg/dL (ref 8–27)
Bilirubin Total: 0.3 mg/dL (ref 0.0–1.2)
CO2: 25 mmol/L (ref 20–29)
Calcium: 8.5 mg/dL — ABNORMAL LOW (ref 8.6–10.2)
Chloride: 97 mmol/L (ref 96–106)
Creatinine, Ser: 0.99 mg/dL (ref 0.76–1.27)
Globulin, Total: 3.6 g/dL (ref 1.5–4.5)
Glucose: 107 mg/dL — ABNORMAL HIGH (ref 70–99)
Potassium: 4.6 mmol/L (ref 3.5–5.2)
Sodium: 137 mmol/L (ref 134–144)
Total Protein: 6.5 g/dL (ref 6.0–8.5)
eGFR: 81 mL/min/{1.73_m2} (ref >59)

## 2022-06-08 LAB — ANA W/REFLEX: ANA Titer 1: NEGATIVE

## 2022-06-08 LAB — RHEUMATOID FACTOR: Rheumatoid fact SerPl-aCnc: 12.1 IU/mL (ref <14.0)

## 2022-06-08 LAB — SEDIMENTATION RATE: Sed Rate: 29 mm/hr (ref 0–30)

## 2022-06-08 MED ORDER — LIDOCAINE HCL (PF) 1 % IJ SOLN
INTRAMUSCULAR | Status: AC
  Filled 2022-06-08: qty 10

## 2022-06-08 NOTE — Progress Notes (Signed)
Pt presented to [place] for diagnostic thoracentesis after CXR revealed bilateral pleural effusions. Upon US examination, there was no fluid on L and a very small fluid pocket on R that was not large enough to safely access.  Exam was ended and explained to patient.  Patient was in agreement with findings.      Narda Rutherford, AGNP-BC 06/08/2022, 11:11 AM

## 2022-06-20 ENCOUNTER — Ambulatory Visit (HOSPITAL_COMMUNITY)
Admission: RE | Admit: 2022-06-20 | Discharge: 2022-06-20 | Disposition: A | Discharge status: Home / Self Care | Payer: Medicare Other | Source: Ambulatory Visit | Attending: Pulmonary Disease | Admitting: Pulmonary Disease

## 2022-06-20 ENCOUNTER — Ambulatory Visit: Payer: Medicare Other | Admitting: Pulmonary Disease

## 2022-06-20 ENCOUNTER — Encounter: Payer: Self-pay | Admitting: Pulmonary Disease

## 2022-06-20 VITALS — BP 140/78 | HR 92 | Ht 67.0 in | Wt 208.2 lb

## 2022-06-20 DIAGNOSIS — J90 Pleural effusion, not elsewhere classified: Secondary | ICD-10-CM | POA: Diagnosis present

## 2022-06-20 NOTE — Progress Notes (Signed)
[facility]  Chief Complaint  Patient presents with   Follow-up    Breathing doing better    Past Surgical History:  He  has a past surgical history that includes Appendectomy; Hernia repair; Hand surgery; Esophagogastroduodenoscopy (12/16/2010); Esophagectomy; and Colonoscopy (03/10/2011).  Past Medical History:  Recurrent DVT, Gout, GERD  Constitutional:  BP (!) 140/78   Pulse 92   Ht 5\' 7"  (1.702 m)   Wt 208 lb 3.2 oz (94.4 kg)   SpO2 97% Comment: ra  BMI 32.61 kg/m   Brief Summary:  Jose Farmer is a 72 y.o. male with dyspnea and pleural effusions.      Subjective:   Lab tests were unremarkable.  Thoracic ultrasound didn't show enough fluid to warrant a thoracentesis.  Breathing is better.  He is able to get more air into his lungs.  Not having much cough anymore.  His weight is down by about 30 lbs since December 2023.  He still doesn't have much of an appetite.  Physical Exam:   Appearance - well kempt   ENMT - no sinus tenderness, no oral exudate, no LAN, Mallampati 3 airway, no stridor  Respiratory - equal breath sounds bilaterally, no wheezing or rales  CV - s1s2 regular rate and rhythm, no murmurs  Ext - no clubbing, no edema  Skin - no rashes  Psych - normal mood and affect    Pulmonary testing:    Chest Imaging:    Cardiac Tests:  Echo 05/31/22 >> EF 70 to 75%, plaque on aortic arch  Social History:  He  reports that he has never smoked. He has never used smokeless tobacco. He reports that he does not drink alcohol and does not use drugs.  Family History:  His family history includes Colon cancer (age of onset: 47) in his sister.     Assessment/Plan:   Dyspnea on exertion, non-productive cough and bilateral pleural effusions. - clinically improved - will repeat chest xray today >> if better, then no further pulmonary follow up needed; if he has persistent changes would then need a CT  chest  History of recurrent DVT. - pradaxa from his PCP - explained he would need to be off pradaxa 2 days prior to having thoracentesis done  Time Spent Involved in Patient Care on Day of Examination:  26 minutes  Follow up:   Patient Instructions  Chest xray at [facility].  Will call to schedule follow up if needed after reviewing your chest xray.  Medication List:   Allergies as of 06/20/2022       Reactions   Indomethacin Other (See Comments)   Hallucinations    Latex    Stockings for circulation break patient out if they contain latex        Medication List        Accurate as of June 20, 2022  2:07 PM. If you have any questions, ask your nurse or doctor.          acetaminophen 500 MG tablet Commonly known as: TYLENOL Take 1,000 mg by mouth every 6 (six) hours as needed for moderate pain or fever.   allopurinol 300 MG tablet Commonly known as: ZYLOPRIM Take 150 mg by mouth daily.   cetirizine 10 MG tablet Commonly known as: ZYRTEC Take 10 mg by mouth daily.   Dexilant 60 MG capsule Generic drug: dexlansoprazole Take 1 capsule by mouth daily.   famotidine 20 MG tablet Commonly known  as: PEPCID Take 1 tablet by mouth as needed.   furosemide 40 MG tablet Commonly known as: LASIX Take 40 mg by mouth daily.   Pradaxa 150 MG Caps capsule Generic drug: dabigatran Take 1 capsule by mouth 2 (two) times daily.   Vitamin D 50 MCG (2000 UT) tablet Take 2,000 Units by mouth daily.        Signature:  Chesley Mires, MD [place] Pager - [phone] 06/20/2022, 2:07 PM

## 2022-06-20 NOTE — Patient Instructions (Signed)
Chest xray at [facility].  Will call to schedule follow up if needed after reviewing your chest xray.

## 2022-06-22 ENCOUNTER — Telehealth: Payer: Self-pay | Admitting: Pulmonary Disease

## 2022-06-22 NOTE — Telephone Encounter (Signed)
Will call patient once results are received from Dr. Halford Chessman.

## 2022-06-22 NOTE — Telephone Encounter (Signed)
Pt called the office wanting to know the results of his recent xray. States he is able to see this on mychart but can't interpret it so he is calling the office for verification.  Dr. Halford Chessman, please advise.

## 2022-06-26 ENCOUNTER — Other Ambulatory Visit: Payer: Self-pay

## 2022-06-26 DIAGNOSIS — J90 Pleural effusion, not elsewhere classified: Secondary | ICD-10-CM

## 2022-06-26 NOTE — Telephone Encounter (Signed)
CXR 06/20/22 >> persistent basilar effusions bilaterally   Please let him know that his chest xray still shows fluid around his lungs.  Please schedule CT chest without contrast to assess further.  Will call to schedule follow up after CT chest completed.

## 2022-06-26 NOTE — Telephone Encounter (Signed)
Called and spoke w/ pt he verbalized understanding. Placing CT order now.

## 2022-07-21 ENCOUNTER — Ambulatory Visit: Payer: Medicare Other | Attending: Internal Medicine | Admitting: Internal Medicine

## 2022-07-21 ENCOUNTER — Encounter: Payer: Self-pay | Admitting: Internal Medicine

## 2022-07-21 VITALS — BP 118/76 | HR 66 | Ht 67.0 in | Wt 201.0 lb

## 2022-07-21 DIAGNOSIS — I700 Atherosclerosis of aorta: Secondary | ICD-10-CM

## 2022-07-21 DIAGNOSIS — M7989 Other specified soft tissue disorders: Secondary | ICD-10-CM

## 2022-07-21 DIAGNOSIS — R0609 Other forms of dyspnea: Secondary | ICD-10-CM | POA: Diagnosis not present

## 2022-07-21 DIAGNOSIS — I5030 Unspecified diastolic (congestive) heart failure: Secondary | ICD-10-CM | POA: Diagnosis not present

## 2022-07-21 NOTE — Patient Instructions (Signed)
Medication Instructions:  Your physician recommends that you continue on your current medications as directed. Please refer to the Current Medication list given to you today.  *If you need a refill on your cardiac medications before your next appointment, please call your pharmacy*   Lab Work: None If you have labs (blood work) drawn today and your tests are completely normal, you will receive your results only by: MyChart Message (if you have MyChart) OR A paper copy in the mail If you have any lab test that is abnormal or we need to change your treatment, we will call you to review the results.   Testing/Procedures: Your physician has requested that you have a lexiscan myoview. For further information please visit [email or web address] Please follow instruction sheet, as given.  Your physician has requested that you have a lower or upper extremity venous reflux. This test is an ultrasound of the veins in the legs or arms. It looks at venous blood flow that carries blood from the heart to the legs or arms. Allow one hour for a Lower Venous exam. Allow thirty minutes for an Upper Venous exam. There are no restrictions or special instructions.    Follow-Up: At [facility], you and your health needs are our priority.  As part of our continuing mission to provide you with exceptional heart care, we have created designated Provider Care Teams.  These Care Teams include your primary Cardiologist (physician) and Advanced Practice Providers (APPs -  Physician Assistants and Nurse Practitioners) who all work together to provide you with the care you need, when you need it.  We recommend signing up for the patient portal called "MyChart".  Sign up information is provided on this After Visit Summary.  MyChart is used to connect with patients for Virtual Visits (Telemedicine).  Patients are able to view lab/test results, encounter notes, upcoming appointments, etc.  Non-urgent messages can be  sent to your provider as well.   To learn more about what you can do with MyChart, go to [email or web address].    Your next appointment:   6 month(s)  Provider:   Luane School, MD    Other Instructions

## 2022-07-21 NOTE — Progress Notes (Signed)
Cardiology Office Note  Date: 07/21/2022   ID: Jose Farmer, Sokalski 03/14/1951, MRN [id number]  PCP:  Alvina Filbert, MD  Cardiologist:  Marjo Bicker, MD Electrophysiologist:  None   Reason for Office Visit: HFpEF follow-up   History of Present Illness: FRASIER SPRINGBORN is a 72 y.o. 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  Patient reported having symptoms of dry cough with occasional productive cough and intermittent DOE x 2 weeks for which he was admitted to [facility] in 3/24 and treated for possible HFpEF exacerbation which improved with IV Lasix.He had bilateral pleural effusions for which he was also sent to pulmonology. Echocardiogram from admission showed normal LVEF and normal diastology parameters.  He is here for posthospitalization follow-up visit.  Reported having continued cough despite discharge from the hospital.  His PCP increased her p.o. Lasix dose from 40 mg to 60 mg after which his symptoms completely resolved.  Denied any angina, dizziness, lightheadedness, syncope, palpitations.  He has chronic leg swelling and thinks this could be from the DVT he had in 1990s.  He wears compression socks.  Past Medical History:  Diagnosis Date   DVT (deep venous thrombosis) (HCC)    21 years ago, maintained on Coumadin recently switched to Pradaxa.    GERD (gastroesophageal reflux disease)    Gout    S/P colonoscopy 1998   Dr. Elder Cyphers, normal   S/P endoscopy Sept 2012   mild gastritis, negative biopsies, short esophagus, not candidate for reflux surgery or Bravo capsule placement    Past Surgical History:  Procedure Laterality Date   APPENDECTOMY     COLONOSCOPY  03/10/2011   Procedure: COLONOSCOPY;  Surgeon: Arlyce Harman, MD;  Location: [facility];  Service: Endoscopy;  Laterality: N/A;  10:00   ESOPHAGECTOMY     ? per pt report   ESOPHAGOGASTRODUODENOSCOPY  12/16/2010    Procedure: ESOPHAGOGASTRODUODENOSCOPY (EGD);  Surgeon: Arlyce Harman, MD;  Location: [facility];  Service: Endoscopy;  Laterality: N/A;  2:00   HAND SURGERY     tendons   HERNIA REPAIR     ventral    Current Outpatient Medications  Medication Sig Dispense Refill   acetaminophen (TYLENOL) 500 MG tablet Take 1,000 mg by mouth every 6 (six) hours as needed for moderate pain or fever.     allopurinol (ZYLOPRIM) 300 MG tablet Take 150 mg by mouth daily.     cetirizine (ZYRTEC) 10 MG tablet Take 10 mg by mouth daily.     Cholecalciferol (VITAMIN D) 50 MCG (2000 UT) tablet Take 2,000 Units by mouth daily.     DEXILANT 60 MG capsule Take 1 capsule by mouth daily.     famotidine (PEPCID) 20 MG tablet Take 1 tablet by mouth as needed.     furosemide (LASIX) 20 MG tablet Take 20 mg by mouth daily. Take in addition to 40 mg for total of 60 mg qd     furosemide (LASIX) 40 MG tablet Take 40 mg by mouth daily. Take with 20 mg for total of 60 mg qd     PRADAXA 150 MG CAPS capsule Take 1 capsule by mouth 2 (two) times daily.     No current facility-administered medications for this visit.   Allergies:  Indomethacin and Latex   Social History: The patient  reports that he has never smoked. He has never used smokeless tobacco. He reports that he  does not drink alcohol and does not use drugs.   Family History: The patient's family history includes Cancer in his father; Colon cancer (age of onset: 45) in his sister.   ROS:  Please see the history of present illness. Otherwise, complete review of systems is positive for none.  All other systems are reviewed and negative.   Physical Exam: VS:  BP 118/76   Pulse 66   Ht 5\' 7"  (1.702 m)   Wt 201 lb (91.2 kg)   SpO2 99%   BMI 31.48 kg/m , BMI Body mass index is 31.48 kg/m.  Wt Readings from Last 3 Encounters:  07/21/22 201 lb (91.2 kg)  06/20/22 208 lb 3.2 oz (94.4 kg)  06/06/22 210 lb 12.8 oz (95.6 kg)    General: Patient appears comfortable  at rest. HEENT: Conjunctiva and lids normal, oropharynx clear with moist mucosa. Neck: Supple, no elevated JVP or carotid bruits, no thyromegaly. Lungs: Clear to auscultation, nonlabored breathing at rest. Cardiac: Regular rate and rhythm, no S3 or significant systolic murmur, no pericardial rub. Abdomen: Soft, nontender, no hepatomegaly, bowel sounds present, no guarding or rebound. Extremities: No pitting edema, distal pulses 2+. Skin: Warm and dry. Musculoskeletal: No kyphosis. Neuropsychiatric: Alert and oriented x3, affect grossly appropriate.  Recent Labwork: 05/30/2022: B Natriuretic Peptide 162.0 06/01/2022: Magnesium 2.0 06/06/2022: ALT 16; AST 13; BUN 12; Creatinine, Ser 0.99; Hemoglobin 12.6; Platelets 509; Potassium 4.6; Sodium 137     Component Value Date/Time   CHOL 120 06/01/2022 0506   TRIG 44 06/01/2022 0506   HDL 25 (L) 06/01/2022 0506   CHOLHDL 4.8 06/01/2022 0506   VLDL 9 06/01/2022 0506   LDLCALC 86 06/01/2022 0506    Other Studies Reviewed Today: Echocardiogram from 3/24 LVEF normal Normal diastology Grade 4 aortic plaque  Assessment and Plan: Patient is a 72 year old M known to have history DVT 1990s on Pradaxa, HFpEF, aortic plaque is here for posthospitalization follow-up visit.  # Possible HFpEF -Patient reported having symptoms of dry cough with occasional productive cough and intermittent DOE x 2 weeks for which he was admitted to [facility] in 3/24 and treated for possible HFpEF exacerbation which improved with IV Lasix.He had bilateral pleural effusions for which he was also sent to pulmonology. Echocardiogram from admission showed normal LVEF and normal diastology parameters. He probably has abnormal diastology with exertion.  Upon discharge, his symptoms did not resolve but after increasing the p.o. Lasix dose from 40 mg to 60 mg, his symptoms completely resolved.  HFpEF being an anginal equivalent, will obtain Lexiscan.  # History of DVT in  1990s on Pradaxa: Provoked incident after he had partial esophagectomy.  Follow with PCP for further management. # Grade 4 aortic plaque: Already on Pradaxa, will continue it for now.  If he ever comes off Pradaxa, will start him on aspirin or Plavix monotherapy. # Bilateral lower EXTR swelling likely secondary to chronic venous insufficiency: Will obtain venous duplex study in lower extremities.   I have spent a total of 30 minutes with patient reviewing chart, EKGs, labs and examining patient as well as establishing an assessment and plan that was discussed with the patient.  > 50% of time was spent in direct patient care.    Medication Adjustments/Labs and Tests Ordered: Current medicines are reviewed at length with the patient today.  Concerns regarding medicines are outlined above.   Tests Ordered: Orders Placed This Encounter  Procedures   NM Myocar Multi W/Spect W/Wall Motion / EF  VAS [place] LOWER EXTREMITY VENOUS REFLUX    Medication Changes: No orders of the defined types were placed in this encounter.   Disposition:  Follow up  6 months  Signed, Trevion Hoben Verne Spurr, MD, 07/21/2022 10:10 AM    [facility] at [facility] [address] [place], [place] [address]

## 2022-07-31 ENCOUNTER — Encounter (HOSPITAL_BASED_OUTPATIENT_CLINIC_OR_DEPARTMENT_OTHER)
Admission: RE | Admit: 2022-07-31 | Discharge: 2022-07-31 | Disposition: A | Discharge status: Home / Self Care | Payer: Medicare Other | Source: Ambulatory Visit | Attending: Internal Medicine | Admitting: Internal Medicine

## 2022-07-31 ENCOUNTER — Ambulatory Visit (HOSPITAL_COMMUNITY)
Admission: RE | Admit: 2022-07-31 | Discharge: 2022-07-31 | Disposition: A | Discharge status: Home / Self Care | Payer: Medicare Other | Source: Ambulatory Visit | Attending: Internal Medicine | Admitting: Internal Medicine

## 2022-07-31 DIAGNOSIS — R0609 Other forms of dyspnea: Secondary | ICD-10-CM | POA: Insufficient documentation

## 2022-07-31 LAB — NM MYOCAR MULTI W/SPECT W/WALL MOTION / EF
Base ST Depression (mm): 0 mm
LV dias vol: 91 mL (ref 62–150)
LV sys vol: 48 mL
Nuc Stress EF: 48 %
Peak HR: 93 {beats}/min
RATE: 0.7
Rest HR: 56 {beats}/min
Rest Nuclear Isotope Dose: 10 mCi
SDS: 3
SRS: 0
SSS: 3
ST Depression (mm): 0 mm
Stress Nuclear Isotope Dose: 32.6 mCi
TID: 1.23

## 2022-07-31 MED ORDER — SODIUM CHLORIDE FLUSH 0.9 % IV SOLN
INTRAVENOUS | Status: AC
  Administered 2022-07-31: 10 mL via INTRAVENOUS
  Filled 2022-07-31: qty 10

## 2022-07-31 MED ORDER — REGADENOSON 0.4 MG/5ML IV SOLN
INTRAVENOUS | Status: AC
  Administered 2022-07-31: 0.4 mg via INTRAVENOUS
  Filled 2022-07-31: qty 5

## 2022-07-31 MED ORDER — TECHNETIUM TC 99M TETROFOSMIN IV KIT
30.0000 | PACK | Freq: Once | INTRAVENOUS | Status: AC | PRN
  Administered 2022-07-31: 32.6 via INTRAVENOUS

## 2022-07-31 MED ORDER — TECHNETIUM TC 99M TETROFOSMIN IV KIT
10.0000 | PACK | Freq: Once | INTRAVENOUS | Status: AC | PRN
  Administered 2022-07-31: 10 via INTRAVENOUS

## 2022-08-01 ENCOUNTER — Telehealth: Payer: Self-pay

## 2022-08-01 NOTE — Telephone Encounter (Signed)
-----   Message from Marjo Bicker, MD sent at 08/01/2022 11:09 AM EDT ----- Normal stress test.

## 2022-08-01 NOTE — Telephone Encounter (Signed)
Patient notified via MyChart. PCP copied.  

## 2022-08-07 ENCOUNTER — Telehealth: Payer: Self-pay | Admitting: Internal Medicine

## 2022-08-07 NOTE — Telephone Encounter (Signed)
Patient is requesting call back to discuss upcoming appt for LE VENOUS REFLUX. He states that he does not believe he can make it to [facility] and would like to know what his other options are. Please advise.

## 2022-08-08 ENCOUNTER — Telehealth: Payer: Self-pay | Admitting: Internal Medicine

## 2022-08-08 NOTE — Telephone Encounter (Signed)
I will have the front office staff that scheduled him review.Jose Farmer

## 2022-08-08 NOTE — Telephone Encounter (Signed)
We have messaged MD that patient cannot travel to [facility] for testing.

## 2022-08-08 NOTE — Telephone Encounter (Signed)
Patient can not travel to [place] to have the   VAS [place] LOWER EXTREMITY VENOUS REFLUX test. Test can't be done in [facility].  Patient would like to know if there is any other test that he can have done instead.Marland KitchenMarland Kitchen

## 2022-08-11 NOTE — Telephone Encounter (Signed)
Patient notified via MyChart. Will cancel appt in [place].

## 2022-08-15 ENCOUNTER — Ambulatory Visit (HOSPITAL_COMMUNITY)
Admission: RE | Admit: 2022-08-15 | Discharge: 2022-08-15 | Disposition: A | Discharge status: Home / Self Care | Payer: Medicare Other | Source: Ambulatory Visit | Attending: Pulmonary Disease | Admitting: Pulmonary Disease

## 2022-08-15 DIAGNOSIS — J90 Pleural effusion, not elsewhere classified: Secondary | ICD-10-CM | POA: Diagnosis present

## 2022-08-16 ENCOUNTER — Ambulatory Visit (HOSPITAL_COMMUNITY): Payer: Medicare Other

## 2022-08-21 ENCOUNTER — Telehealth: Payer: Self-pay | Admitting: Pulmonary Disease

## 2022-08-21 NOTE — Telephone Encounter (Signed)
CT chest 08/15/22 >> atherosclerosis, mild ATX, no effusions  Results d/w pt.  Previous pleural effusions resolved.

## 2022-08-23 ENCOUNTER — Other Ambulatory Visit (HOSPITAL_COMMUNITY): Payer: Self-pay | Admitting: Internal Medicine

## 2022-08-23 DIAGNOSIS — R229 Localized swelling, mass and lump, unspecified: Secondary | ICD-10-CM

## 2022-09-06 ENCOUNTER — Ambulatory Visit (HOSPITAL_COMMUNITY)
Admission: RE | Admit: 2022-09-06 | Discharge: 2022-09-06 | Disposition: A | Discharge status: Home / Self Care | Payer: Medicare Other | Source: Ambulatory Visit | Attending: Internal Medicine | Admitting: Internal Medicine

## 2022-09-06 DIAGNOSIS — R229 Localized swelling, mass and lump, unspecified: Secondary | ICD-10-CM | POA: Insufficient documentation

## 2022-09-18 IMAGING — DX DG KNEE COMPLETE 4+V*L*
4 series · 4 of 4 positions shown · non-contrast
Comparison: None.

CLINICAL DATA: Left leg pain and swelling

EXAM:
LEFT KNEE - COMPLETE 4+ VIEW

[knee ap]
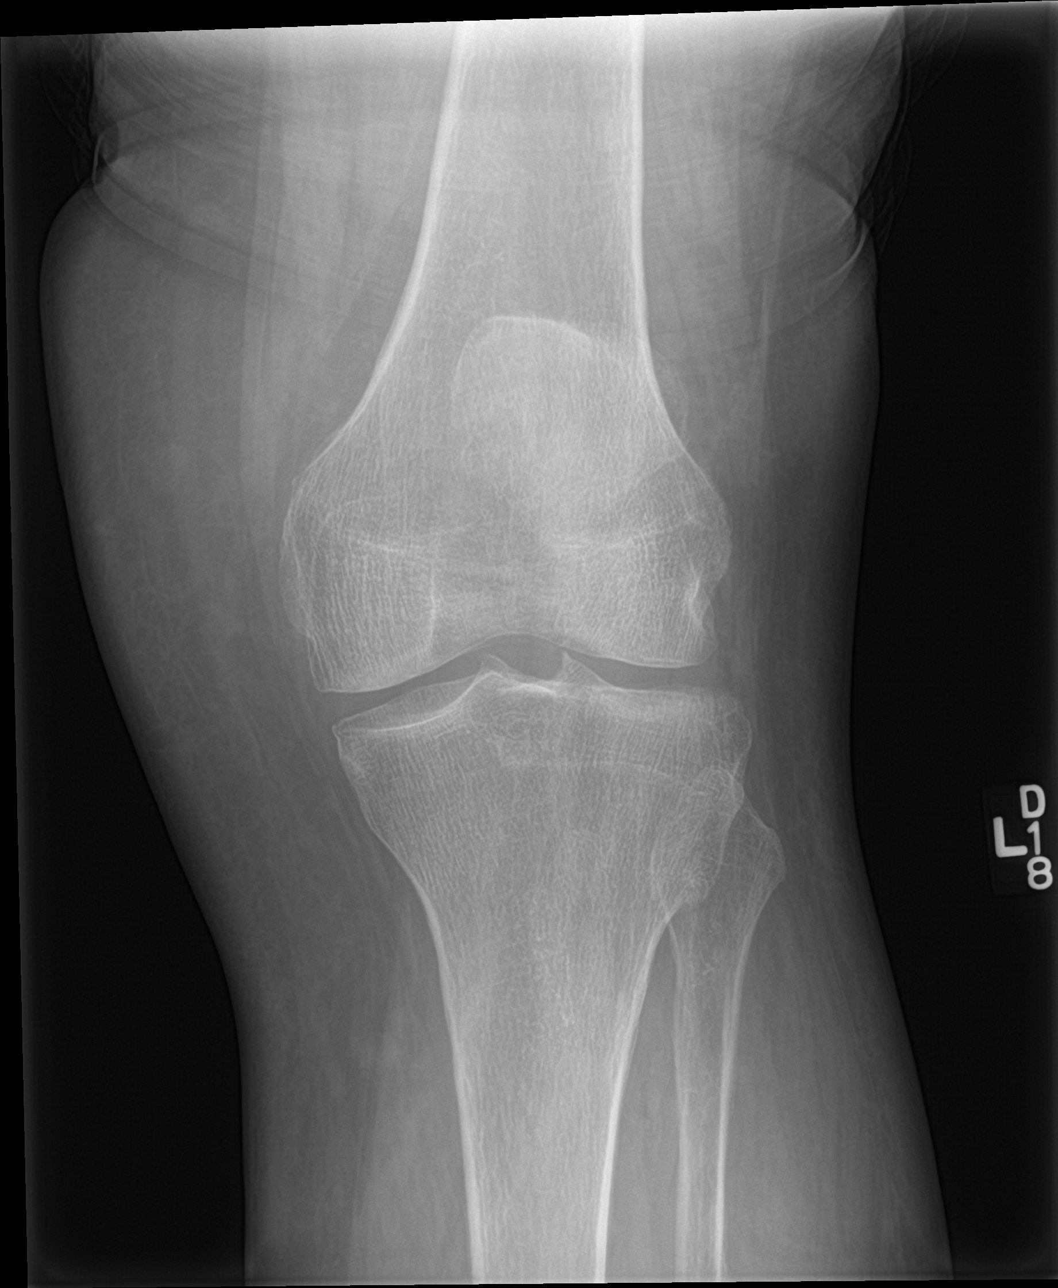

[knee obl (1 of 2)]
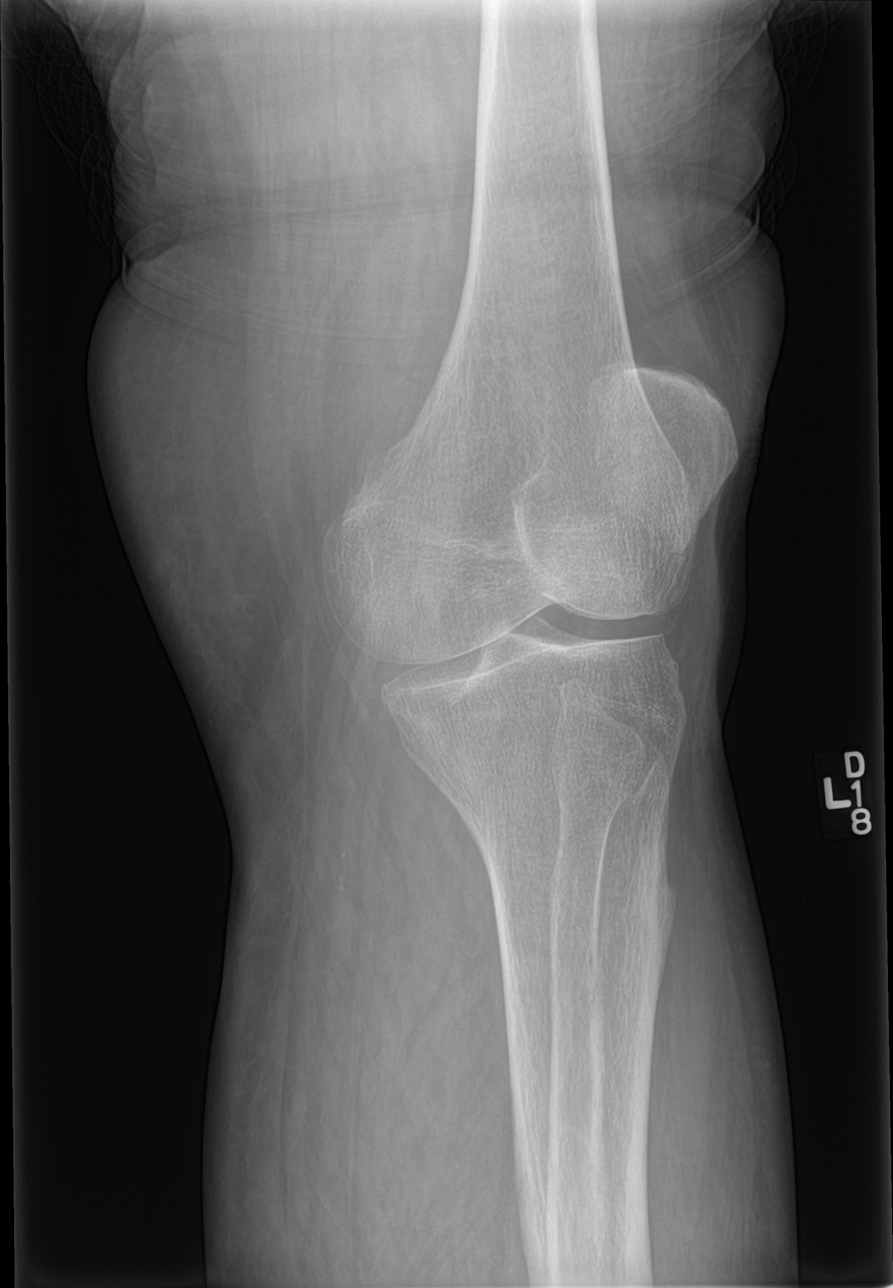

[knee obl (2 of 2)]
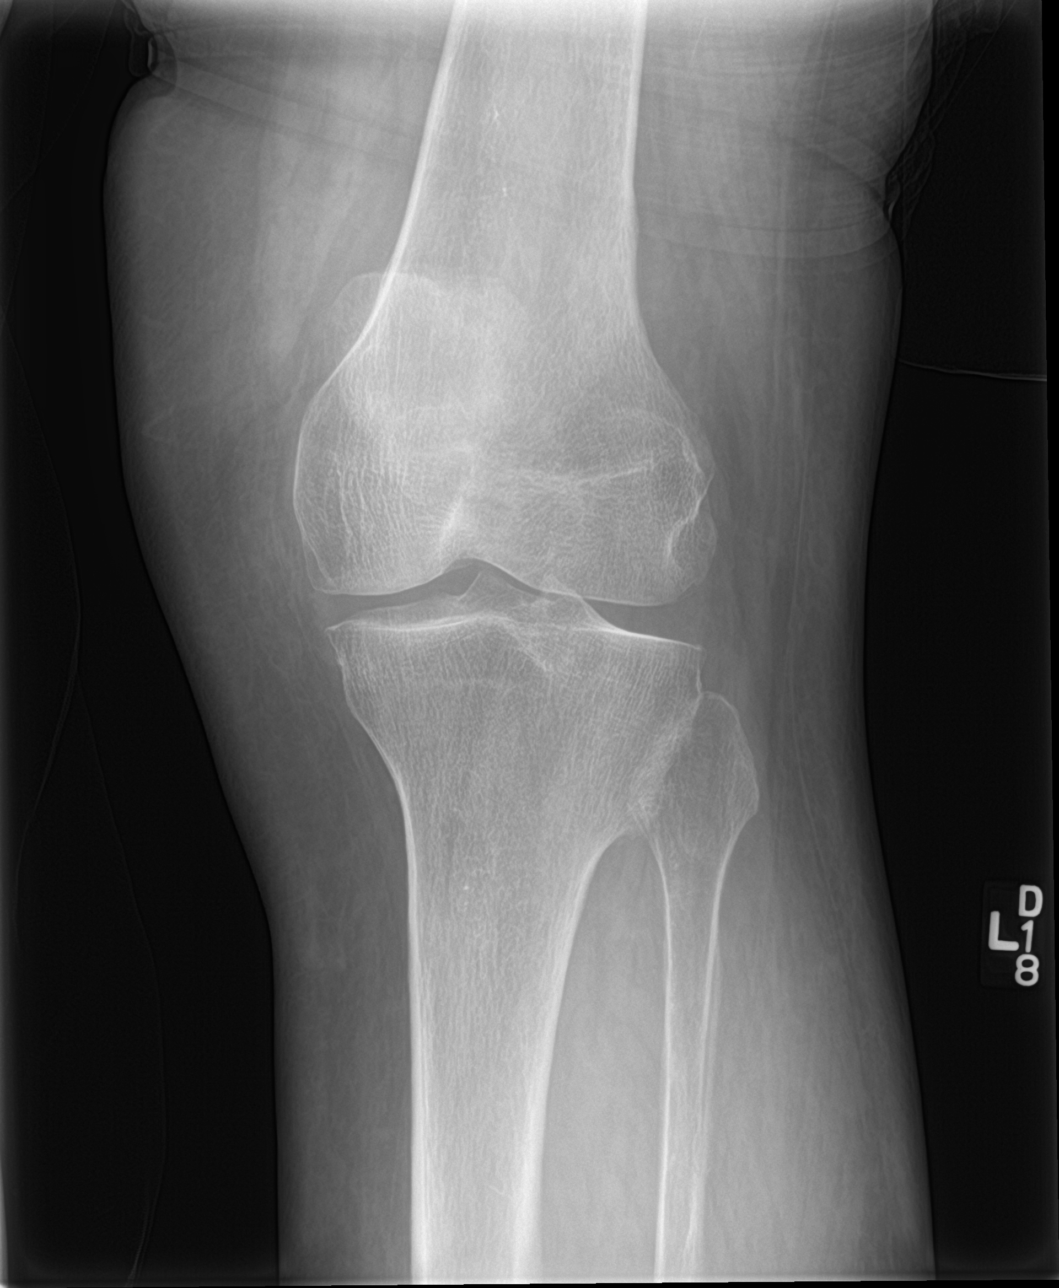

[knee lat]
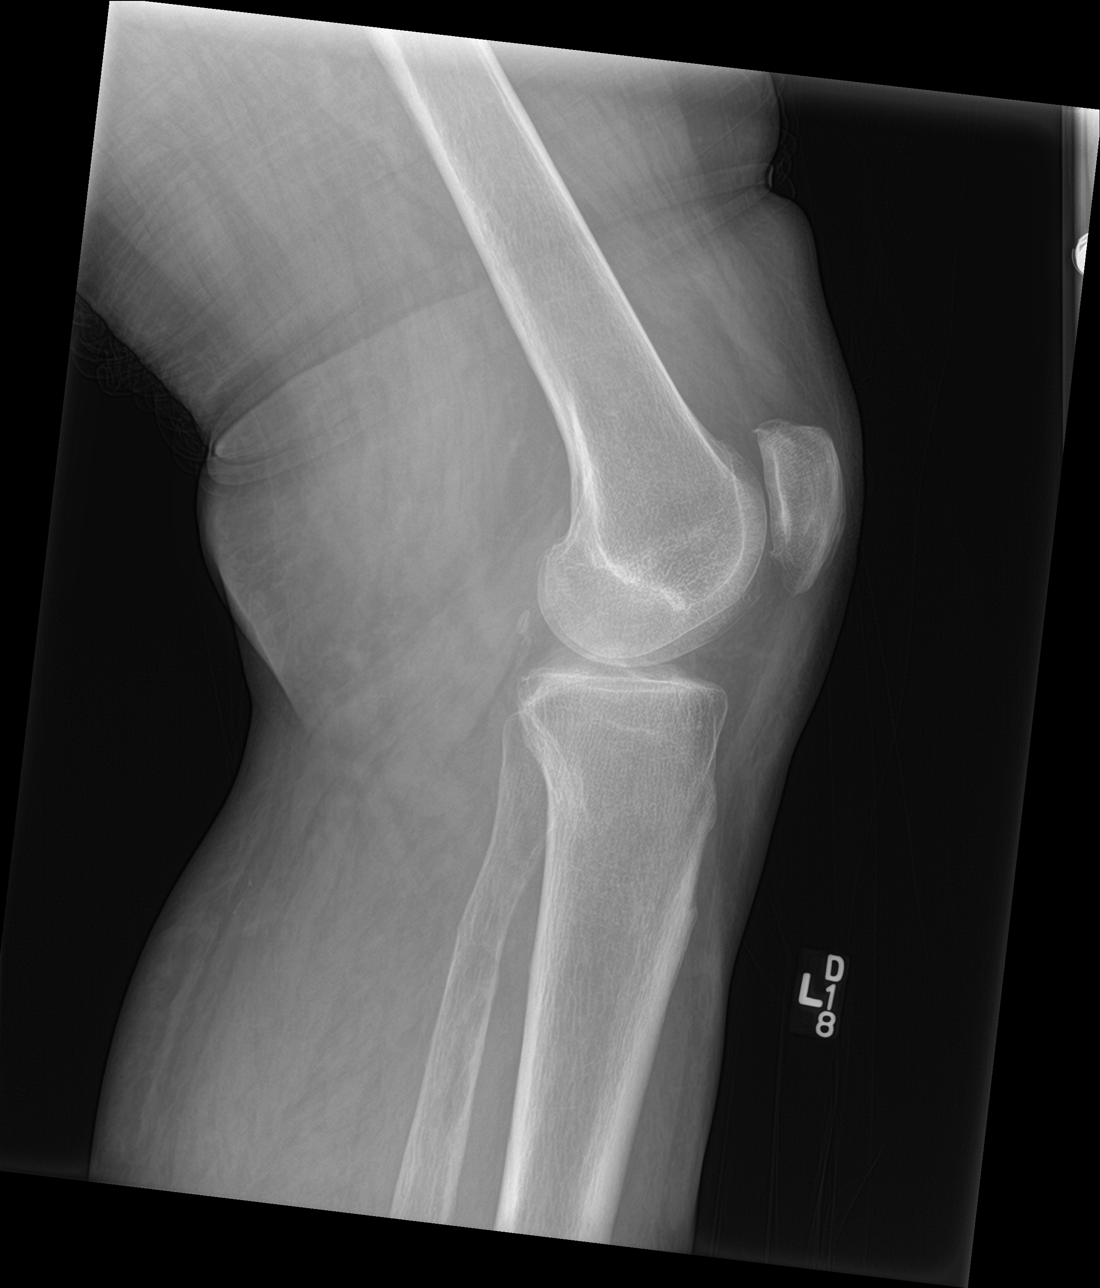

[4 of 4 positions shown; findings below may reference images not displayed]

FINDINGS: Frontal, bilateral oblique, lateral views of the left knee are
obtained. No fracture, subluxation, or dislocation. Minimal
osteophyte formation within all 3 compartments of the left knee.
Joint spaces are relatively well preserved. No joint effusion. Soft
tissues are normal.
IMPRESSION: 1. Mild 3 compartmental osteoarthritis.  No acute bony abnormality.

## 2023-01-22 ENCOUNTER — Encounter: Payer: Self-pay | Admitting: Internal Medicine

## 2023-01-22 ENCOUNTER — Ambulatory Visit: Payer: Medicare Other | Attending: Internal Medicine | Admitting: Internal Medicine

## 2023-01-22 VITALS — BP 122/68 | HR 58 | Ht 66.0 in | Wt 202.6 lb

## 2023-01-22 DIAGNOSIS — I700 Atherosclerosis of aorta: Secondary | ICD-10-CM

## 2023-01-22 DIAGNOSIS — I5030 Unspecified diastolic (congestive) heart failure: Secondary | ICD-10-CM

## 2023-01-22 NOTE — Progress Notes (Signed)
Cardiology Office Note  Date: 01/22/2023   ID: [name], [name] Oct 05, 1950, MRN [id number]  PCP:  Alvina Filbert, MD  Cardiologist:  Marjo Bicker, MD Electrophysiologist:  None    History of Present Illness: Jose Farmer is a 72 y.o. 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  Asymptomatic, compensated.  Takes p.o. Lasix 40 mg in the a.m. and 20 mg in the p.m.  On Pradaxa for provoked DVT that happened in 1990s.  He also has grade 4 aortic plaque for which Plavix is beneficial, since he is already on Pradaxa, will continue Pradaxa.  Echocardiogram showed normal LVEF and normal diastology parameters.  Overall doing great.  No angina, DOE, orthopnea, PND, dizziness, syncope and leg swelling.  He does have chronic venous insufficiency.  Venous reflux was ordered but never scheduled.  He wears compression socks.   Past Medical History:  Diagnosis Date   DVT (deep venous thrombosis) (HCC)    21 years ago, maintained on Coumadin recently switched to Pradaxa.    GERD (gastroesophageal reflux disease)    Gout    S/P colonoscopy 1998   Dr. Elder Cyphers, normal   S/P endoscopy Sept 2012   mild gastritis, negative biopsies, short esophagus, not candidate for reflux surgery or Bravo capsule placement    Past Surgical History:  Procedure Laterality Date   APPENDECTOMY     COLONOSCOPY  03/10/2011   Procedure: COLONOSCOPY;  Surgeon: Arlyce Harman, MD;  Location: [facility];  Service: Endoscopy;  Laterality: N/A;  10:00   ESOPHAGECTOMY     ? per pt report   ESOPHAGOGASTRODUODENOSCOPY  12/16/2010   Procedure: ESOPHAGOGASTRODUODENOSCOPY (EGD);  Surgeon: Arlyce Harman, MD;  Location: [facility];  Service: Endoscopy;  Laterality: N/A;  2:00   HAND SURGERY     tendons   HERNIA REPAIR     ventral    Current Outpatient Medications  Medication Sig Dispense Refill   acetaminophen (TYLENOL) 500 MG  tablet Take 1,000 mg by mouth every 6 (six) hours as needed for moderate pain or fever.     allopurinol (ZYLOPRIM) 300 MG tablet Take 150 mg by mouth daily.     cetirizine (ZYRTEC) 10 MG tablet Take 10 mg by mouth daily.     Cholecalciferol (VITAMIN D) 50 MCG (2000 UT) tablet Take 2,000 Units by mouth daily.     DEXILANT 60 MG capsule Take 1 capsule by mouth daily.     famotidine (PEPCID) 20 MG tablet Take 1 tablet by mouth as needed.     furosemide (LASIX) 20 MG tablet Take 20 mg by mouth daily. Take in addition to 40 mg for total of 60 mg qd     furosemide (LASIX) 40 MG tablet Take 40 mg by mouth daily. Take with 20 mg for total of 60 mg qd     PRADAXA 150 MG CAPS capsule Take 1 capsule by mouth 2 (two) times daily.     No current facility-administered medications for this visit.   Allergies:  Indomethacin and Latex   Social History: The patient  reports that he has never smoked. He has never used smokeless tobacco. He reports that he does not drink alcohol and does not use drugs.   Family History: The patient's family history includes Cancer in his father; Colon cancer (age of onset: 70) in his sister.   ROS:  Please see the history of present illness. Otherwise,  complete review of systems is positive for none.  All other systems are reviewed and negative.   Physical Exam: VS:  BP 122/68 (BP Location: Left Arm)   Pulse (!) 58   Ht 5\' 6"  (1.676 m)   Wt 202 lb 9.6 oz (91.9 kg)   SpO2 99%   BMI 32.70 kg/m , BMI Body mass index is 32.7 kg/m.  Wt Readings from Last 3 Encounters:  01/22/23 202 lb 9.6 oz (91.9 kg)  07/21/22 201 lb (91.2 kg)  06/20/22 208 lb 3.2 oz (94.4 kg)    General: Patient appears comfortable at rest. HEENT: Conjunctiva and lids normal, oropharynx clear with moist mucosa. Neck: Supple, no elevated JVP or carotid bruits, no thyromegaly. Lungs: Clear to auscultation, nonlabored breathing at rest. Cardiac: Regular rate and rhythm, no S3 or significant systolic  murmur, no pericardial rub. Abdomen: Soft, nontender, no hepatomegaly, bowel sounds present, no guarding or rebound. Extremities: No pitting edema, distal pulses 2+. Skin: Warm and dry. Musculoskeletal: No kyphosis. Neuropsychiatric: Alert and oriented x3, affect grossly appropriate.  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  Chronic diastolic heart failure: Asymptomatic, compensated. Continue p.o. Lasix 40 mg in the a.m. and 20 mg in the p.m.  Lexiscan in 07/2022 showed no evidence of ischemia.  Nuclear LVEF was 48%, intermediate risk based on LVEF alone, but echocardiogram from 05/2022 showed normal LVEF.  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  No bleeding and risk of falls.  Grade 4 aortic plaque: Already on Pradaxa, will continue it for now.  If he ever comes off Pradaxa in the future, will need to start him on Plavix monotherapy.  Chronic venous insufficiency: Venous reflux study was ordered but never scheduled.  Continue wearing compression socks.     Disposition:  Follow up  1 year  Signed, Jose Enis Verne Spurr, MD, 01/22/2023 10:40 AM    [facility] [address] [place], [place] [address]

## 2023-01-22 NOTE — Patient Instructions (Signed)

## 2023-11-09 ENCOUNTER — Encounter: Payer: Self-pay | Admitting: Radiology

## 2024-01-21 ENCOUNTER — Encounter: Payer: Self-pay | Admitting: Radiology

## 2024-04-29 ENCOUNTER — Ambulatory Visit: Admitting: Internal Medicine
